# Patient Record
Sex: Female | Born: 1990 | Race: White | Hispanic: Yes | Marital: Single | Smoking: Current every day smoker
Health system: Southern US, Community
[De-identification: ages and names within clinical notes are randomized; demographics above are authoritative.]

## PROBLEM LIST (undated history)

## (undated) ENCOUNTER — Inpatient Hospital Stay: Payer: Self-pay

## (undated) DIAGNOSIS — F32A Depression, unspecified: Secondary | ICD-10-CM

## (undated) DIAGNOSIS — F329 Major depressive disorder, single episode, unspecified: Secondary | ICD-10-CM

## (undated) DIAGNOSIS — M539 Dorsopathy, unspecified: Secondary | ICD-10-CM

## (undated) DIAGNOSIS — F419 Anxiety disorder, unspecified: Secondary | ICD-10-CM

## (undated) DIAGNOSIS — G43909 Migraine, unspecified, not intractable, without status migrainosus: Secondary | ICD-10-CM

## (undated) HISTORY — PX: DENTAL SURGERY: SHX609

## (undated) HISTORY — DX: Migraine, unspecified, not intractable, without status migrainosus: G43.909

## (undated) HISTORY — DX: Dorsopathy, unspecified: M53.9

---

## 2014-08-22 ENCOUNTER — Emergency Department: Payer: Self-pay | Admitting: Emergency Medicine

## 2014-11-30 ENCOUNTER — Emergency Department: Payer: Self-pay | Admitting: Student

## 2015-08-09 ENCOUNTER — Emergency Department
Admission: EM | Admit: 2015-08-09 | Discharge: 2015-08-09 | Disposition: A | Discharge status: Home / Self Care | Payer: Self-pay | Attending: Emergency Medicine | Admitting: Emergency Medicine

## 2015-08-09 ENCOUNTER — Encounter: Payer: Self-pay | Admitting: Medical Oncology

## 2015-08-09 DIAGNOSIS — G8929 Other chronic pain: Secondary | ICD-10-CM | POA: Insufficient documentation

## 2015-08-09 DIAGNOSIS — K0381 Cracked tooth: Secondary | ICD-10-CM | POA: Insufficient documentation

## 2015-08-09 DIAGNOSIS — Z880 Allergy status to penicillin: Secondary | ICD-10-CM | POA: Insufficient documentation

## 2015-08-09 DIAGNOSIS — K047 Periapical abscess without sinus: Secondary | ICD-10-CM | POA: Insufficient documentation

## 2015-08-09 DIAGNOSIS — F17200 Nicotine dependence, unspecified, uncomplicated: Secondary | ICD-10-CM | POA: Insufficient documentation

## 2015-08-09 MED ORDER — HYDROCODONE-ACETAMINOPHEN 5-325 MG PO TABS: 1.0000 | ORAL_TABLET | ORAL | Status: DC | PRN

## 2015-08-09 MED ORDER — CEPHALEXIN 500 MG PO CAPS: 500.0000 mg | ORAL_CAPSULE | Freq: Three times a day (TID) | ORAL | Status: AC

## 2015-08-09 NOTE — ED Notes (Signed)
Assessment per PA 

## 2015-08-09 NOTE — ED Notes (Signed)
Rt upper tooth ache x 3 days, swelling noted.

## 2015-08-09 NOTE — Discharge Instructions (Signed)
Dental Abscess °A dental abscess is a collection of pus in or around a tooth. °CAUSES °This condition is caused by a bacterial infection around the root of the tooth that involves the inner part of the tooth (pulp). It may result from: °· Severe tooth decay. °· Trauma to the tooth that allows bacteria to enter into the pulp, such as a broken or chipped tooth. °· Severe gum disease around a tooth. °SYMPTOMS °Symptoms of this condition include: °· Severe pain in and around the infected tooth. °· Swelling and redness around the infected tooth, in the mouth, or in the face. °· Tenderness. °· Pus drainage. °· Bad breath. °· Bitter taste in the mouth. °· Difficulty swallowing. °· Difficulty opening the mouth. °· Nausea. °· Vomiting. °· Chills. °· Swollen neck glands. °· Fever. °DIAGNOSIS °This condition is diagnosed with examination of the infected tooth. During the exam, your dentist may tap on the infected tooth. Your dentist will also ask about your medical and dental history and may order X-rays. °TREATMENT °This condition is treated by eliminating the infection. This may be done with: °· Antibiotic medicine. °· A root canal. This may be performed to save the tooth. °· Pulling (extracting) the tooth. This may also involve draining the abscess. This is done if the tooth cannot be saved. °HOME CARE INSTRUCTIONS °· Take medicines only as directed by your dentist. °· If you were prescribed antibiotic medicine, finish all of it even if you start to feel better. °· Rinse your mouth (gargle) often with salt water to relieve pain or swelling. °· Do not drive or operate heavy machinery while taking pain medicine. °· Do not apply heat to the outside of your mouth. °· Keep all follow-up visits as directed by your dentist. This is important. °SEEK MEDICAL CARE IF: °· Your pain is worse and is not helped by medicine. °SEEK IMMEDIATE MEDICAL CARE IF: °· You have a fever or chills. °· Your symptoms suddenly get worse. °· You have a  very bad headache. °· You have problems breathing or swallowing. °· You have trouble opening your mouth. °· You have swelling in your neck or around your eye. °  °This information is not intended to replace advice given to you by your health care provider. Make sure you discuss any questions you have with your health care provider. °  °Document Released: 08/24/2005 Document Revised: 01/08/2015 Document Reviewed: 08/21/2014 °Elsevier Interactive Patient Education ©2016 [employer] ° ° ° ° ° ° °OPTIONS FOR DENTAL FOLLOW UP CARE ° °[place] Department of Health and Human Services - Local Safety Net Dental Clinics °http://www.ncdhhs.gov/dph/oralhealth/services/safetynetclinics.htm °  °[facility] (336-562-3123) ° °[facility] (919-933-9087) ° °[facility] (919-663-1744 ext 237) ° °[facility] (336-570-6415) ° °[facility] (919-968-2025) °This clinic caters to the indigent population and is on a lottery system. °Location: °[facility], [address], [place] °Clinic Hours: °Wednesdays from 6pm - 9pm, patients seen by a lottery system. °For dates, call or go to www.med.[facility].edu/[facility]/patients/Dental-[facility] °Services: °Cleanings, fillings and simple extractions. °Payment Options: °DENTAL WORK IS FREE OF CHARGE. Bring proof of income or support. °Best way to get seen: °Arrive at 5:15 pm - this is a lottery, NOT first come/first serve, so arriving earlier will not increase your chances of being seen. °  °  °[facility] °919-537-3737 °Select option 1 for emergencies °  °Location: °[facility], [address], [place] °  Clinic Hours: °No walk-ins accepted - call the day before to schedule an appointment. °Check in times are 9:30 am and 1:30 pm. °Services: °Simple extractions, temporary fillings, pulpectomy/pulp debridement, uncomplicated abscess drainage. °Payment Options: °PAYMENT IS  DUE AT THE TIME OF SERVICE.  Fee is usually $100-200, additional surgical procedures (e.g. abscess drainage) may be extra. °Cash, checks, Visa/MasterCard accepted.  Can file Medicaid if patient is covered for dental - patient should call case worker to check. °No discount for [facility] patients. °Best way to get seen: °MUST call the day before and get onto the schedule. Can usually be seen the next 1-2 days. No walk-ins accepted. °  °  °[facility] °919-933-9087 °  °Location: °[facility], [address], [place] °Clinic Hours: °M, W, Th, F 8am or 1:30pm, Tues 9a or 1:30 - first come/first served. °Services: °Simple extractions, temporary fillings, uncomplicated abscess drainage.  You do not need to be an [place] resident. °Payment Options: °PAYMENT IS DUE AT THE TIME OF SERVICE. °Dental insurance, otherwise sliding scale - bring proof of income or support. °Depending on income and treatment needed, cost is usually $50-200. °Best way to get seen: °Arrive early as it is first come/first served. °  °  °[facility] °919-542-1641 °  °Location: °[address] °Clinic Hours: °Mon-Thu 8a-5p °Services: °Most basic dental services including extractions and fillings. °Payment Options: °PAYMENT IS DUE AT THE TIME OF SERVICE. °Sliding scale, up to 50% off - bring proof if income or support. °Medicaid with dental option accepted. °Best way to get seen: °Call to schedule an appointment, can usually be seen within 2 weeks OR they will try to see walk-ins - show up at 8a or 2p (you may have to wait). °  °  °[facility] °919-245-2435 °[place] RESIDENTS ONLY °  °Location: °[facility], [address], [place], [place] [address] °Clinic Hours: By appointment only. °Monday - Thursday 8am-5pm, Friday 8am-12pm °Services: Cleanings, fillings, extractions. °Payment Options: °PAYMENT IS DUE AT THE TIME OF  SERVICE. °Cash, Visa or MasterCard. Sliding scale - $30 minimum per service. °Best way to get seen: °Come in to office, complete packet and make an appointment - need proof of income °or support monies for each household member and proof of [place] residence. °Usually takes about a month to get in. °  °  °[facility] °[phone] °  °Location: °[address] [place] °Clinic Hours: Walk-in Urgent Care Dental Services are offered Monday-Friday mornings only. °The numbers of emergencies accepted daily is limited to the number of °providers available. °Maximum 15 - Mondays, Wednesdays & Thursdays °Maximum 10 - Tuesdays & Fridays °Services: °You do not need to be a [place] resident to be seen for a dental emergency. °Emergencies are defined as pain, swelling, abnormal bleeding, or dental trauma. Walkins will receive x-rays if needed. °NOTE: Dental cleaning is not an emergency. °Payment Options: °PAYMENT IS DUE AT THE TIME OF SERVICE. °Minimum co-pay is $40.00 for uninsured patients. °Minimum co-pay is $3.00 for Medicaid with dental coverage. °Dental Insurance is accepted and must be presented at time of visit. °Medicare does not cover dental. °Forms of payment: Cash, credit card, checks. °Best way to get seen: °If not previously registered with the clinic, walk-in dental registration begins at 7:15 am and is on a first come/first serve basis. °If previously registered with the clinic, call to make an appointment. °  °  °  [facility] °919-776-4359 °[place] RESIDENTS ONLY °  °Location: °[address], [place], [place] °Clinic Hours: °Mon-Thu 10a-2p °Services: Extractions only! °Payment Options: °FREE (donations accepted) - bring proof of income or support °Best way to get seen: °Call and schedule an appointment OR come at 8am on the 1st Monday of every month (except for holidays) when it is first come/first served. °  °  °[facility] °[phone] °   °Location: °[address], [place] °Clinic Hours: °Friday mornings °Services, Payment Options, Best way to get seen: °Call for info °

## 2015-08-09 NOTE — ED Provider Notes (Signed)
[facility] Emergency Department Provider Note ____________________________________________  Time seen: Approximately 7:56 AM  I have reviewed the triage vital signs and the nursing notes.   HISTORY  Chief Complaint Dental Pain   HPI Marcia Villarreal is a 24 y.o. female who presents to the emergency department for evaluation of dental pain and facial swelling. She reports chronic dental pain and several fractured teeth. Pain started yesterday and swelling was noted upon awakening this morning. She hasn't taken Tylenol without relief.   History reviewed. No pertinent past medical history.  There are no active problems to display for this patient.   History reviewed. No pertinent past surgical history.  Current Outpatient Rx  Name  Route  Sig  Dispense  Refill  . cephALEXin (KEFLEX) 500 MG capsule   Oral   Take 1 capsule (500 mg total) by mouth 3 (three) times daily.   40 capsule   0   . HYDROcodone-acetaminophen (NORCO/VICODIN) 5-325 MG tablet   Oral   Take 1 tablet by mouth every 4 (four) hours as needed.   12 tablet   0     Allergies Penicillins and Sulfa antibiotics  No family history on file.  Social History Social History  Substance Use Topics  . Smoking status: Current Every Day Smoker  . Smokeless tobacco: None  . Alcohol Use: None    Review of Systems Constitutional: No fever/chills Eyes: No visual changes. ENT: No sore throat. Cardiovascular: Denies chest pain. Respiratory: Denies shortness of breath. Gastrointestinal: No abdominal pain.  No nausea, no vomiting.  Genitourinary: Negative for dysuria. Musculoskeletal: Negative for back pain. Skin: Negative for rash. Neurological: Negative for headaches, focal weakness or numbness. 10-point ROS otherwise negative.  ____________________________________________   PHYSICAL EXAM:  VITAL SIGNS: ED Triage Vitals  Enc Vitals Group     BP 08/09/15 0746 142/80 mmHg   Pulse Rate 08/09/15 0746 106     Resp 08/09/15 0746 16     Temp 08/09/15 0746 97.6 F (36.4 C)     Temp Source 08/09/15 0746 Oral     SpO2 08/09/15 0746 96 %     Weight 08/09/15 0746 220 lb (99.791 kg)     Height 08/09/15 0746 5\' 5"  (1.651 m)     Head Cir --      Peak Flow --      Pain Score 08/09/15 0748 9     Pain Loc --      Pain Edu? --      Excl. in GC? --     Constitutional: Alert and oriented. Well appearing and in no acute distress. Eyes: Conjunctivae are normal. PERRL. EOMI. Head: Atraumatic. Nose: No congestion/rhinnorhea. Mouth/Throat: Mucous membranes are moist.  Oropharynx non-erythematous. Periodontal Exam    Neck: No stridor.  Hematological/Lymphatic/Immunilogical: No cervical lymphadenopathy. Cardiovascular:   Good peripheral circulation. Respiratory: Normal respiratory effort.  No retractions. Musculoskeletal: No lower extremity tenderness nor edema.  No joint effusions. Neurologic:  Normal speech and language. No gross focal neurologic deficits are appreciated. Speech is normal. No gait instability. Skin:  Skin is warm, dry and intact. No rash noted. Psychiatric: Mood and affect are normal. Speech and behavior are normal.  ____________________________________________   LABS (all labs ordered are listed, but only abnormal results are displayed)  Labs Reviewed - No data to display ____________________________________________   RADIOLOGY  Not indicated ____________________________________________   PROCEDURES  Procedure(s) performed: None  Critical Care performed: No  ____________________________________________   INITIAL IMPRESSION / ASSESSMENT AND PLAN /  ED COURSE  Pertinent labs & imaging results that were available during my care of the patient were reviewed by me and considered in my medical decision making (see chart for details).  Patient was advised to see the dentist within 14 days. Also advised to take the antibiotic until  finished. Instructed to return to the ER for symptoms that change or worsen if you are unable to schedule an appointment. ____________________________________________   FINAL CLINICAL IMPRESSION(S) / ED DIAGNOSES  Final diagnoses:  Dental abscess       Chinita Pester, FNP 08/09/15 0820  Myrna Blazer, MD 08/09/15 1540

## 2015-11-16 IMAGING — CR DG CHEST 2V
1 series · 2 of 2 positions shown · non-contrast
Comparison: None.

CLINICAL DATA: Initial evaluation for cough.

EXAM:
CHEST  2 VIEW

[Series 1: dxr chest pa (or ap) and lateral · 0.14mm/px · 2 of 2 slices shown]
[im 1/2]
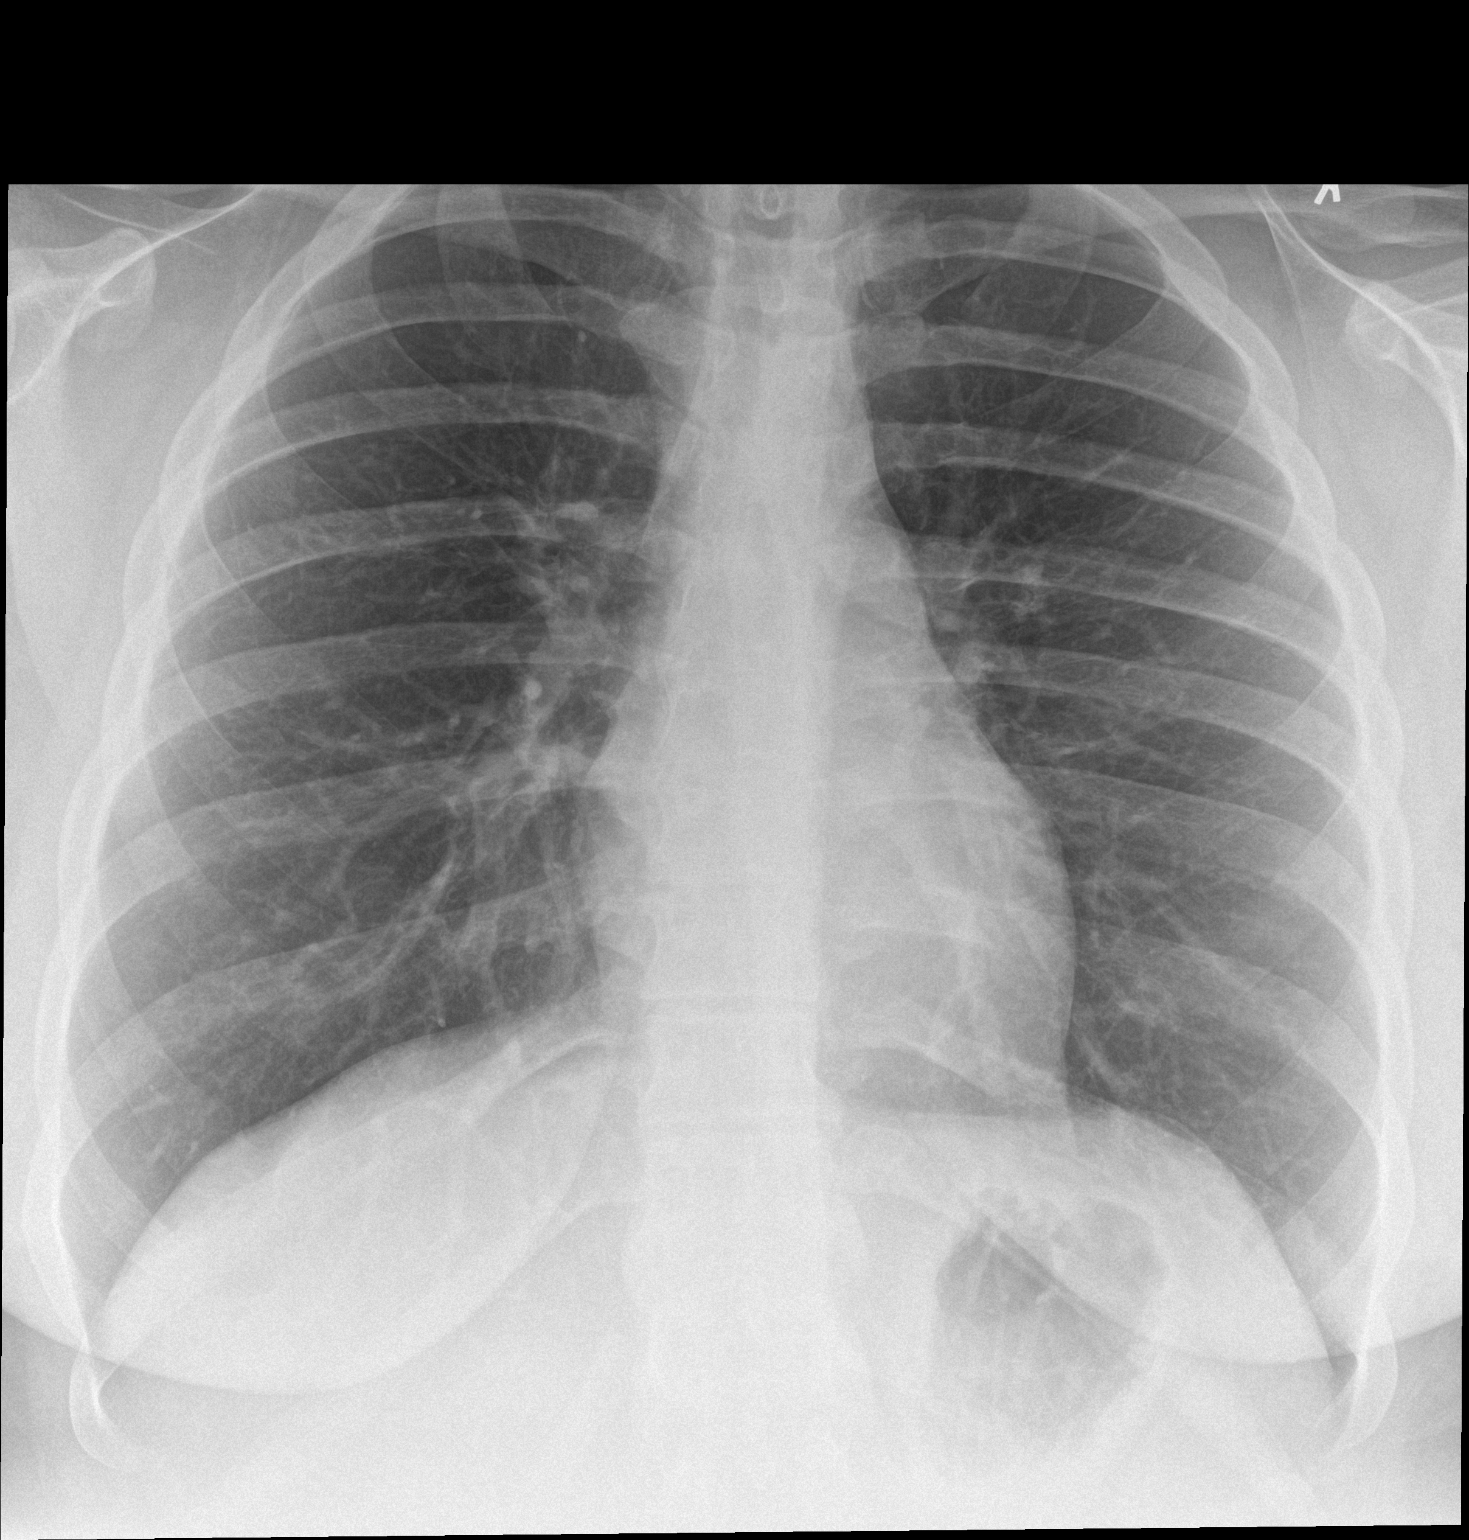
[im 2/2]
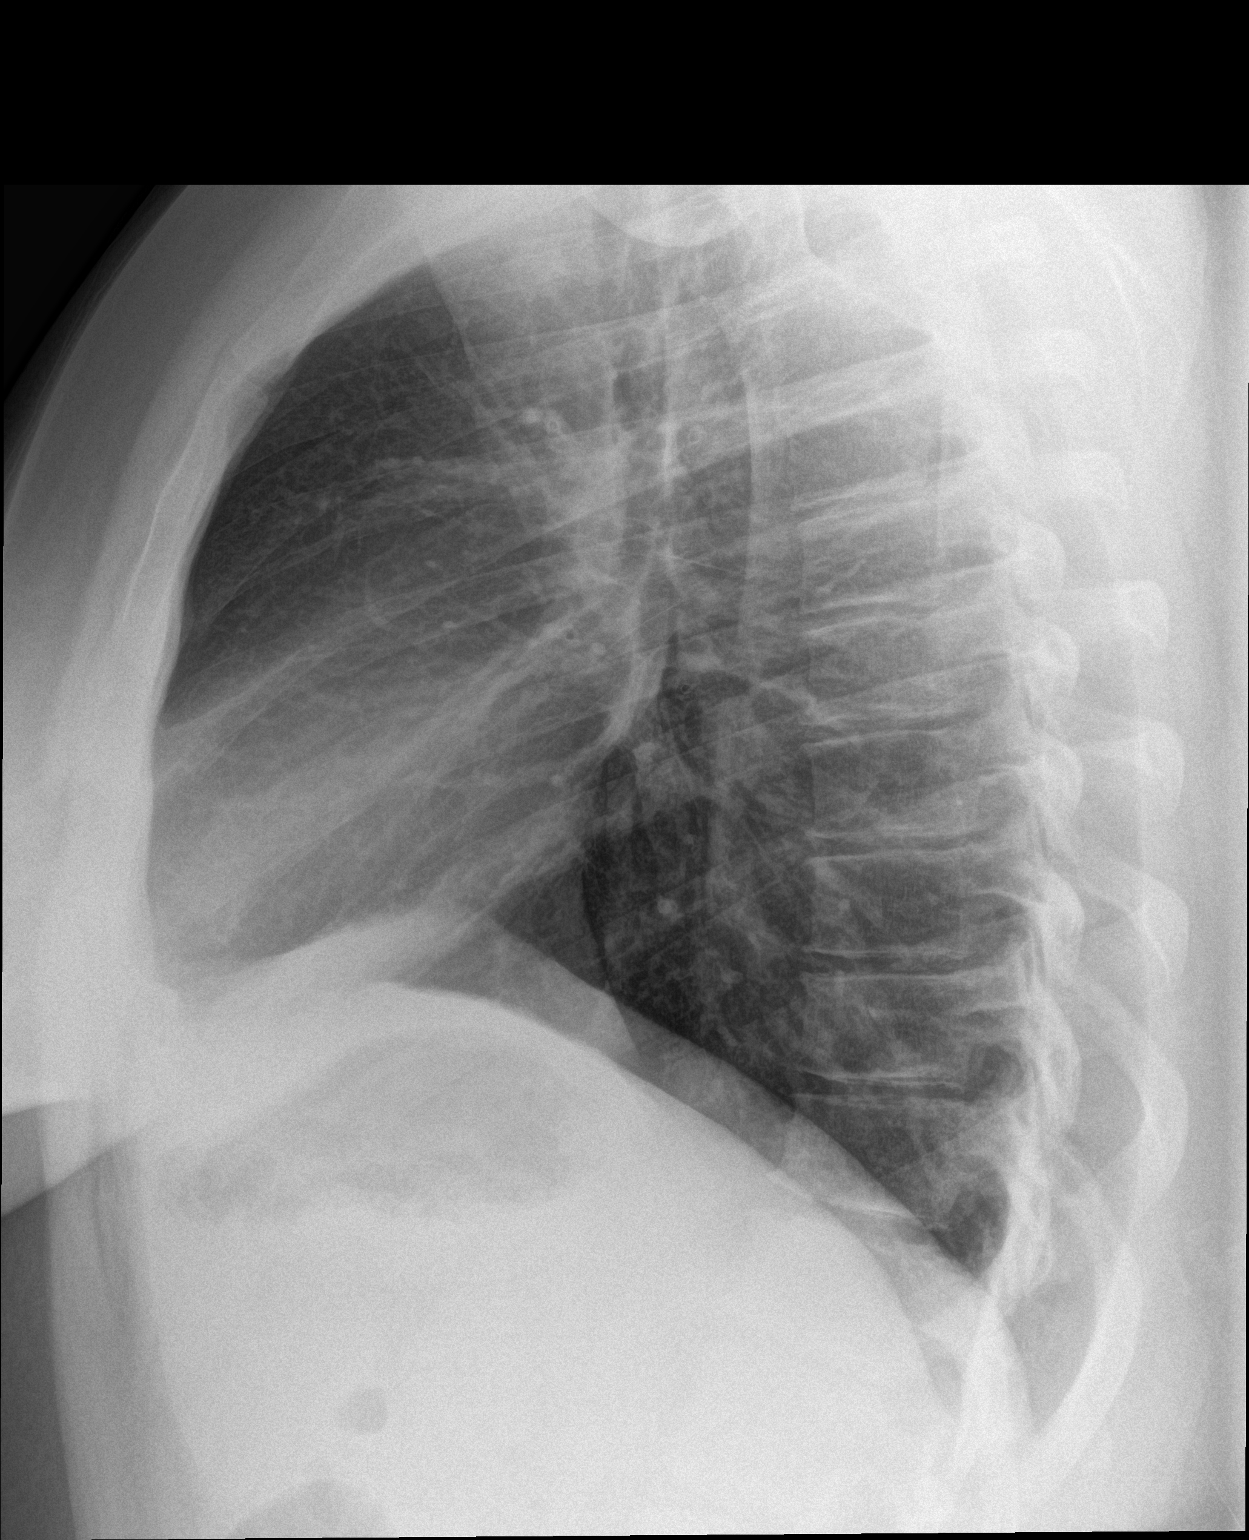

[2 of 2 positions shown; findings below may reference images not displayed]

FINDINGS: The cardiac and mediastinal silhouettes are within normal limits.

The lungs are normally inflated. No airspace consolidation, pleural
effusion, or pulmonary edema is identified. There is no
pneumothorax.

No acute osseous abnormality identified.
IMPRESSION: No active cardiopulmonary disease.

## 2017-04-16 ENCOUNTER — Ambulatory Visit (INDEPENDENT_AMBULATORY_CARE_PROVIDER_SITE_OTHER): Payer: Medicaid Other | Admitting: Certified Nurse Midwife

## 2017-04-16 VITALS — BP 111/98 | HR 95 | Ht 65.0 in | Wt 272.0 lb

## 2017-04-16 DIAGNOSIS — R109 Unspecified abdominal pain: Secondary | ICD-10-CM

## 2017-04-16 DIAGNOSIS — N926 Irregular menstruation, unspecified: Secondary | ICD-10-CM

## 2017-04-16 DIAGNOSIS — Z1389 Encounter for screening for other disorder: Secondary | ICD-10-CM

## 2017-04-16 DIAGNOSIS — O26899 Other specified pregnancy related conditions, unspecified trimester: Secondary | ICD-10-CM

## 2017-04-16 DIAGNOSIS — Z3481 Encounter for supervision of other normal pregnancy, first trimester: Secondary | ICD-10-CM

## 2017-04-16 DIAGNOSIS — Z113 Encounter for screening for infections with a predominantly sexual mode of transmission: Secondary | ICD-10-CM

## 2017-04-16 NOTE — Progress Notes (Signed)
Marcia Villarreal presents for NOB nurse interview visit. Pregnancy confirmation done at [facility] on July 10,2018. UPT: positive.  Pt has had some abdominal cramping, somewhat more than menstrual type that comes and goes. No vaginal bleeding. Hx of irregular menses. Ultrasound ordered for abdominal pain, hx irregular menses. Encouraged pt to drink lots of water and rest, this should help. Currently drinking 2-3 cups coffee, some water.  G-2.  P-1001. Pregnancy education material explained and given. No cats in the home. NOB labs ordered. TSH/HbgA1c due to BMI of 48. HIV labs and Drug screen were explained optional and she did not decline. Drug screen ordered. Pt states she has smoked Marijuana during pregnancy a few times because of nausea. Does not plan to continue. PNV encouraged. Genetic screening options discussed. Genetic testing: Unsure.  To contact insurance regarding what is covered. Pt may discuss with provider. Pt. To follow up with provider in 3 weeks for NOB physical.  All questions answered.

## 2017-04-16 NOTE — Patient Instructions (Signed)
Pregnancy and Zika Virus Disease Zika virus disease, or Zika, is an illness that can spread to people from mosquitoes that carry the virus. It may also spread from person to person through infected body fluids. Zika first occurred in [place], but recently it has spread to new areas. The virus occurs in tropical climates. The location of Zika continues to change. Most people who become infected with Zika virus do not develop serious illness. However, Zika may cause birth defects in an unborn baby whose mother is infected with the virus. It may also increase the risk of miscarriage. What are the symptoms of Zika virus disease? In many cases, people who have been infected with Zika virus do not develop any symptoms. If symptoms appear, they usually start about a week after the person is infected. Symptoms are usually mild. They may include:  Fever.  Rash.  Red eyes.  Joint pain.  How does Zika virus disease spread? The main way that Zika virus spreads is through the bite of a certain type of mosquito. Unlike most types of mosquitos, which bite only at night, the type of mosquito that carries Zika virus bites both at night and during the day. Zika virus can also spread through sexual contact, through a blood transfusion, and from a mother to her baby before or during birth. Once you have had Zika virus disease, it is unlikely that you will get it again. Can I pass Zika to my baby during pregnancy? Yes, Zika can pass from a mother to her baby before or during birth. What problems can Zika cause for my baby? A woman who is infected with Zika virus while pregnant is at risk of having her baby born with a condition in which the brain or head is smaller than expected (microcephaly). Babies who have microcephaly can have developmental delays, seizures, hearing problems, and vision problems. Having Zika virus disease during pregnancy can also increase the risk of miscarriage. How can Zika virus disease be  prevented? There is no vaccine to prevent Zika. The best way to prevent the disease is to avoid infected mosquitoes and avoid exposure to body fluids that can spread the virus. Avoid any possible exposure to Zika by taking the following precautions. For women and their sex partners:  Avoid traveling to high-risk areas. The locations where Zika is being reported change often. To identify high-risk areas, check the CDC travel website: www.cdc.gov/zika/geo/index.html  If you or your sex partner must travel to a high-risk area, talk with a health care provider before and after traveling.  Take all precautions to avoid mosquito bites if you live in, or travel to, any of the high-risk areas. Insect repellents are safe to use during pregnancy.  Ask your health care provider when it is safe to have sexual contact.  For women:  If you are pregnant or trying to become pregnant, avoid sexual contact with persons who may have been exposed to Zika virus, persons who have possible symptoms of Zika, or persons whose history you are unsure about. If you choose to have sexual contact with someone who may have been exposed to Zika virus, use condoms correctly during the entire duration of sexual activity, every time. Do not share sexual devices, as you may be exposed to body fluids.  Ask your health care provider about when it is safe to attempt pregnancy after a possible exposure to Zika virus.  What steps should I take to avoid mosquito bites? Take these steps to avoid mosquito bites   when you are in a high-risk area:  Wear loose clothing that covers your arms and legs.  Limit your outdoor activities.  Do not open windows unless they have window screens.  Sleep under mosquito nets.  Use insect repellent. The best insect repellents have:  DEET, picaridin, oil of lemon eucalyptus (OLE), or IR3535 in them.  Higher amounts of an active ingredient in them.  Remember that insect repellents are safe to  use during pregnancy.  Do not use OLE on children who are younger than 3 years of age. Do not use insect repellent on babies who are younger than 2 months of age.  Cover your child's stroller with mosquito netting. Make sure the netting fits snugly and that any loose netting does not cover your child's mouth or nose. Do not use a blanket as a mosquito-protection cover.  Do not apply insect repellent underneath clothing.  If you are using sunscreen, apply the sunscreen before applying the insect repellent.  Treat clothing with permethrin. Do not apply permethrin directly to your skin. Follow label directions for safe use.  Get rid of standing water, where mosquitoes may reproduce. Standing water is often found in items such as buckets, bowls, animal food dishes, and flowerpots.  When you return from traveling to any high-risk area, continue taking actions to protect yourself against mosquito bites for 3 weeks, even if you show no signs of illness. This will prevent spreading Zika virus to uninfected mosquitoes. What should I know about the sexual transmission of Zika? People can spread Zika to their sexual partners during vaginal, anal, or oral sex, or by sharing sexual devices. Many people with Zika do not develop symptoms, so a person could spread the disease without knowing that they are infected. The greatest risk is to women who are pregnant or who may become pregnant. Zika virus can live longer in semen than it can live in blood. Couples can prevent sexual transmission of the virus by:  Using condoms correctly during the entire duration of sexual activity, every time. This includes vaginal, anal, and oral sex.  Not sharing sexual devices. Sharing increases your risk of being exposed to body fluid from another person.  Avoiding all sexual activity until your health care provider says it is safe.  Should I be tested for Zika virus? A sample of your blood can be tested for Zika virus. A  pregnant woman should be tested if she may have been exposed to the virus or if she has symptoms of Zika. She may also have additional tests done during her pregnancy, such ultrasound testing. Talk with your health care provider about which tests are recommended. This information is not intended to replace advice given to you by your health care provider. Make sure you discuss any questions you have with your health care provider. Document Released: 05/15/2015 Document Revised: 01/30/2016 Document Reviewed: 05/08/2015 Elsevier Interactive Patient Education  2018 [employer] Hyperemesis Gravidarum Hyperemesis gravidarum is a severe form of nausea and vomiting that happens during pregnancy. Hyperemesis is worse than morning sickness. It may cause you to have nausea or vomiting all day for many days. It may keep you from eating and drinking enough food and liquids. Hyperemesis usually occurs during the first half (the first 20 weeks) of pregnancy. It often goes away once a woman is in her second half of pregnancy. However, sometimes hyperemesis continues through an entire pregnancy. What are the causes? The cause of this condition is not known. It may be related   to changes in chemicals (hormones) in the body during pregnancy, such as the high level of pregnancy hormone (human chorionic gonadotropin) or the increase in the female sex hormone (estrogen). What are the signs or symptoms? Symptoms of this condition include:  Severe nausea and vomiting.  Nausea that does not go away.  Vomiting that does not allow you to keep any food down.  Weight loss.  Body fluid loss (dehydration).  Having no desire to eat, or not liking food that you have previously enjoyed.  How is this diagnosed? This condition may be diagnosed based on:  A physical exam.  Your medical history.  Your symptoms.  Blood tests.  Urine tests.  How is this treated? This condition may be managed with medicine. If  medicines to do not help relieve nausea and vomiting, you may need to receive fluids through an IV tube at the hospital. Follow these instructions at home:  Take over-the-counter and prescription medicines only as told by your health care provider.  Avoid iron pills and multivitamins that contain iron for the first 3-4 months of pregnancy. If you take prescription iron pills, do not stop taking them unless your health care provider approves.  Take the following actions to help prevent nausea and vomiting: ? In the morning, before getting out of bed, try eating a couple of dry crackers or a piece of toast. ? Avoid foods and smells that upset your stomach. Fatty and spicy foods may make nausea worse. ? Eat 5-6 small meals a day. ? Do not drink fluids while eating meals. Drink between meals. ? Eat or suck on things that have ginger in them. Ginger can help relieve nausea. ? Avoid food preparation. The smell of food can spoil your appetite or trigger nausea.  Follow instructions from your health care provider about eating or drinking restrictions.  For snacks, eat high-protein foods, such as cheese.  Keep all follow-up and pre-birth (prenatal) visits as told by your health care provider. This is important. Contact a health care provider if:  You have pain in your abdomen.  You have a severe headache.  You have vision problems.  You are losing weight. Get help right away if:  You cannot drink fluids without vomiting.  You vomit blood.  You have constant nausea and vomiting.  You are very weak.  You are very thirsty.  You feel dizzy.  You faint.  You have a fever or other symptoms that last for more than 2-3 days.  You have a fever and your symptoms suddenly get worse. Summary  Hyperemesis gravidarum is a severe form of nausea and vomiting that happens during pregnancy.  Making some changes to your eating habits may help relieve nausea and vomiting.  This condition may  be managed with medicine.  If medicines to do not help relieve nausea and vomiting, you may need to receive fluids through an IV tube at the hospital. This information is not intended to replace advice given to you by your health care provider. Make sure you discuss any questions you have with your health care provider. Document Released: 08/24/2005 Document Revised: 04/22/2016 Document Reviewed: 04/22/2016 Elsevier Interactive Patient Education  2017 [employer] First Trimester of Pregnancy The first trimester of pregnancy is from week 1 until the end of week 13 (months 1 through 3). During this time, your baby will begin to develop inside you. At 6-8 weeks, the eyes and face are formed, and the heartbeat can be seen on ultrasound. At the   end of 12 weeks, all the baby's organs are formed. Prenatal care is all the medical care you receive before the birth of your baby. Make sure you get good prenatal care and follow all of your doctor's instructions. Follow these instructions at home: Medicines  Take over-the-counter and prescription medicines only as told by your doctor. Some medicines are safe and some medicines are not safe during pregnancy.  Take a prenatal vitamin that contains at least 600 micrograms (mcg) of folic acid.  If you have trouble pooping (constipation), take medicine that will make your stool soft (stool softener) if your doctor approves. Eating and drinking  Eat regular, healthy meals.  Your doctor will tell you the amount of weight gain that is right for you.  Avoid raw meat and uncooked cheese.  If you feel sick to your stomach (nauseous) or throw up (vomit): ? Eat 4 or 5 small meals a day instead of 3 large meals. ? Try eating a few soda crackers. ? Drink liquids between meals instead of during meals.  To prevent constipation: ? Eat foods that are high in fiber, like fresh fruits and vegetables, whole grains, and beans. ? Drink enough fluids to keep your pee  (urine) clear or pale yellow. Activity  Exercise only as told by your doctor. Stop exercising if you have cramps or pain in your lower belly (abdomen) or low back.  Do not exercise if it is too hot, too humid, or if you are in a place of great height (high altitude).  Try to avoid standing for long periods of time. Move your legs often if you must stand in one place for a long time.  Avoid heavy lifting.  Wear low-heeled shoes. Sit and stand up straight.  You can have sex unless your doctor tells you not to. Relieving pain and discomfort  Wear a good support bra if your breasts are sore.  Take warm water baths (sitz baths) to soothe pain or discomfort caused by hemorrhoids. Use hemorrhoid cream if your doctor says it is okay.  Rest with your legs raised if you have leg cramps or low back pain.  If you have puffy, bulging veins (varicose veins) in your legs: ? Wear support hose or compression stockings as told by your doctor. ? Raise (elevate) your feet for 15 minutes, 3-4 times a day. ? Limit salt in your food. Prenatal care  Schedule your prenatal visits by the twelfth week of pregnancy.  Write down your questions. Take them to your prenatal visits.  Keep all your prenatal visits as told by your doctor. This is important. Safety  Wear your seat belt at all times when driving.  Make a list of emergency phone numbers. The list should include numbers for family, friends, the hospital, and police and fire departments. General instructions  Ask your doctor for a referral to a local prenatal class. Begin classes no later than at the start of month 6 of your pregnancy.  Ask for help if you need counseling or if you need help with nutrition. Your doctor can give you advice or tell you where to go for help.  Do not use hot tubs, steam rooms, or saunas.  Do not douche or use tampons or scented sanitary pads.  Do not cross your legs for long periods of time.  Avoid all herbs  and alcohol. Avoid drugs that are not approved by your doctor.  Do not use any tobacco products, including cigarettes, chewing tobacco, and electronic cigarettes.   If you need help quitting, ask your doctor. You may get counseling or other support to help you quit.  Avoid cat litter boxes and soil used by cats. These carry germs that can cause birth defects in the baby and can cause a loss of your baby (miscarriage) or stillbirth.  Visit your dentist. At home, brush your teeth with a soft toothbrush. Be gentle when you floss. Contact a doctor if:  You are dizzy.  You have mild cramps or pressure in your lower belly.  You have a nagging pain in your belly area.  You continue to feel sick to your stomach, you throw up, or you have watery poop (diarrhea).  You have a bad smelling fluid coming from your vagina.  You have pain when you pee (urinate).  You have increased puffiness (swelling) in your face, hands, legs, or ankles. Get help right away if:  You have a fever.  You are leaking fluid from your vagina.  You have spotting or bleeding from your vagina.  You have very bad belly cramping or pain.  You gain or lose weight rapidly.  You throw up blood. It may look like coffee grounds.  You are around people who have German measles, fifth disease, or chickenpox.  You have a very bad headache.  You have shortness of breath.  You have any kind of trauma, such as from a fall or a car accident. Summary  The first trimester of pregnancy is from week 1 until the end of week 13 (months 1 through 3).  To take care of yourself and your unborn baby, you will need to eat healthy meals, take medicines only if your doctor tells you to do so, and do activities that are safe for you and your baby.  Keep all follow-up visits as told by your doctor. This is important as your doctor will have to ensure that your baby is healthy and growing well. This information is not intended to replace  advice given to you by your health care provider. Make sure you discuss any questions you have with your health care provider. Document Released: 02/10/2008 Document Revised: 09/01/2016 Document Reviewed: 09/01/2016 Elsevier Interactive Patient Education  2017 [employer] Commonly Asked Questions During Pregnancy  Cats: A parasite can be excreted in cat feces.  To avoid exposure you need to have another person empty the little box.  If you must empty the litter box you will need to wear gloves.  Wash your hands after handling your cat.  This parasite can also be found in raw or undercooked meat so this should also be avoided.  Colds, Sore Throats, Flu: Please check your medication sheet to see what you can take for symptoms.  If your symptoms are unrelieved by these medications please call the office.  Dental Work: Most any dental work your dentist recommends is permitted.  X-rays should only be taken during the first trimester if absolutely necessary.  Your abdomen should be shielded with a lead apron during all x-rays.  Please notify your provider prior to receiving any x-rays.  Novocaine is fine; gas is not recommended.  If your dentist requires a note from us prior to dental work please call the office and we will provide one for you.  Exercise: Exercise is an important part of staying healthy during your pregnancy.  You may continue most exercises you were accustomed to prior to pregnancy.  Later in your pregnancy you will most likely notice you have difficulty with activities   requiring balance like riding a bicycle.  It is important that you listen to your body and avoid activities that put you at a higher risk of falling.  Adequate rest and staying well hydrated are a must!  If you have questions about the safety of specific activities ask your provider.    Exposure to Children with illness: Try to avoid obvious exposure; report any symptoms to us when noted,  If you have chicken pos, red  measles or mumps, you should be immune to these diseases.   Please do not take any vaccines while pregnant unless you have checked with your OB provider.  Fetal Movement: After 28 weeks we recommend you do "kick counts" twice daily.  Lie or sit down in a calm quiet environment and count your baby movements "kicks".  You should feel your baby at least 10 times per hour.  If you have not felt 10 kicks within the first hour get up, walk around and have something sweet to eat or drink then repeat for an additional hour.  If count remains less than 10 per hour notify your provider.  Fumigating: Follow your pest control agent's advice as to how long to stay out of your home.  Ventilate the area well before re-entering.  Hemorrhoids:   Most over-the-counter preparations can be used during pregnancy.  Check your medication to see what is safe to use.  It is important to use a stool softener or fiber in your diet and to drink lots of liquids.  If hemorrhoids seem to be getting worse please call the office.   Hot Tubs:  Hot tubs Jacuzzis and saunas are not recommended while pregnant.  These increase your internal body temperature and should be avoided.  Intercourse:  Sexual intercourse is safe during pregnancy as long as you are comfortable, unless otherwise advised by your provider.  Spotting may occur after intercourse; report any bright red bleeding that is heavier than spotting.  Labor:  If you know that you are in labor, please go to the hospital.  If you are unsure, please call the office and let us help you decide what to do.  Lifting, straining, etc:  If your job requires heavy lifting or straining please check with your provider for any limitations.  Generally, you should not lift items heavier than that you can lift simply with your hands and arms (no back muscles)  Painting:  Paint fumes do not harm your pregnancy, but may make you ill and should be avoided if possible.  Latex or water based paints  have less odor than oils.  Use adequate ventilation while painting.  Permanents & Hair Color:  Chemicals in hair dyes are not recommended as they cause increase hair dryness which can increase hair loss during pregnancy.  " Highlighting" and permanents are allowed.  Dye may be absorbed differently and permanents may not hold as well during pregnancy.  Sunbathing:  Use a sunscreen, as skin burns easily during pregnancy.  Drink plenty of fluids; avoid over heating.  Tanning Beds:  Because their possible side effects are still unknown, tanning beds are not recommended.  Ultrasound Scans:  Routine ultrasounds are performed at approximately 20 weeks.  You will be able to see your baby's general anatomy an if you would like to know the gender this can usually be determined as well.  If it is questionable when you conceived you may also receive an ultrasound early in your pregnancy for dating purposes.  Otherwise ultrasound exams   are not routinely performed unless there is a medical necessity.  Although you can request a scan we ask that you pay for it when conducted because insurance does not cover " patient request" scans.  Work: If your pregnancy proceeds without complications you may work until your due date, unless your physician or employer advises otherwise.  Round Ligament Pain/Pelvic Discomfort:  Sharp, shooting pains not associated with bleeding are fairly common, usually occurring in the second trimester of pregnancy.  They tend to be worse when standing up or when you remain standing for long periods of time.  These are the result of pressure of certain pelvic ligaments called "round ligaments".  Rest, Tylenol and heat seem to be the most effective relief.  As the womb and fetus grow, they rise out of the pelvis and the discomfort improves.  Please notify the office if your pain seems different than that described.  It may represent a more serious condition.   

## 2017-04-17 LAB — CBC WITH DIFFERENTIAL/PLATELET
BASOS: 0 %
Basophils Absolute: 0 10*3/uL (ref 0.0–0.2)
EOS (ABSOLUTE): 0.2 10*3/uL (ref 0.0–0.4)
EOS: 2 %
HEMATOCRIT: 41 % (ref 34.0–46.6)
HEMOGLOBIN: 13.2 g/dL (ref 11.1–15.9)
IMMATURE GRANS (ABS): 0 10*3/uL (ref 0.0–0.1)
IMMATURE GRANULOCYTES: 0 %
Lymphocytes Absolute: 2.8 10*3/uL (ref 0.7–3.1)
Lymphs: 28 %
MCH: 28.7 pg (ref 26.6–33.0)
MCHC: 32.2 g/dL (ref 31.5–35.7)
MCV: 89 fL (ref 79–97)
MONOCYTES: 7 %
MONOS ABS: 0.7 10*3/uL (ref 0.1–0.9)
NEUTROS PCT: 63 %
Neutrophils Absolute: 6.2 10*3/uL (ref 1.4–7.0)
Platelets: 215 10*3/uL (ref 150–379)
RBC: 4.6 x10E6/uL (ref 3.77–5.28)
RDW: 15.1 % (ref 12.3–15.4)
WBC: 9.8 10*3/uL (ref 3.4–10.8)

## 2017-04-17 LAB — VARICELLA ZOSTER ANTIBODY, IGG: Varicella zoster IgG: <135 index — ABNORMAL LOW (ref >165)

## 2017-04-17 LAB — HEMOGLOBIN A1C
ESTIMATED AVERAGE GLUCOSE: 120 mg/dL
Hgb A1c MFr Bld: 5.8 % — ABNORMAL HIGH (ref 4.8–5.6)

## 2017-04-17 LAB — ANTIBODY SCREEN: Antibody Screen: NEGATIVE

## 2017-04-17 LAB — HIV ANTIBODY (ROUTINE TESTING W REFLEX): HIV SCREEN 4TH GENERATION: NONREACTIVE

## 2017-04-17 LAB — RUBELLA SCREEN: RUBELLA: 2.55 {index} (ref >0.99)

## 2017-04-17 LAB — TSH: TSH: 1.32 u[IU]/mL (ref 0.450–4.500)

## 2017-04-17 LAB — HEPATITIS B SURFACE ANTIGEN: Hepatitis B Surface Ag: NEGATIVE

## 2017-04-17 LAB — RPR: RPR: NONREACTIVE

## 2017-04-17 LAB — RH TYPE: Rh Factor: POSITIVE

## 2017-04-17 LAB — ABO

## 2017-04-18 LAB — GC/CHLAMYDIA PROBE AMP
Chlamydia trachomatis, NAA: NEGATIVE
Neisseria gonorrhoeae by PCR: NEGATIVE

## 2017-04-18 LAB — CULTURE, OB URINE

## 2017-04-18 LAB — URINE CULTURE, OB REFLEX

## 2017-04-22 ENCOUNTER — Encounter: Payer: Self-pay | Admitting: Certified Nurse Midwife

## 2017-04-22 ENCOUNTER — Telehealth: Payer: Self-pay | Admitting: Certified Nurse Midwife

## 2017-04-22 NOTE — Telephone Encounter (Signed)
Pt stated she did not understand any of her lab results. Results explained.

## 2017-04-22 NOTE — Telephone Encounter (Signed)
Please call patient - [facility]

## 2017-04-23 LAB — MICROSCOPIC EXAMINATION: CASTS: NONE SEEN /LPF

## 2017-04-23 LAB — MONITOR DRUG PROFILE 14(MW)
AMPHETAMINE SCREEN URINE: NEGATIVE ng/mL
BARBITURATE SCREEN URINE: NEGATIVE ng/mL
BENZODIAZEPINE SCREEN, URINE: NEGATIVE ng/mL
Buprenorphine, Urine: NEGATIVE ng/mL
Cocaine (Metab) Scrn, Ur: NEGATIVE ng/mL
Creatinine(Crt), U: 283.5 mg/dL (ref 20.0–300.0)
Fentanyl, Urine: NEGATIVE pg/mL
Meperidine Screen, Urine: NEGATIVE ng/mL
Methadone Screen, Urine: NEGATIVE ng/mL
OPIATE SCREEN URINE: NEGATIVE ng/mL
OXYCODONE+OXYMORPHONE UR QL SCN: NEGATIVE ng/mL
PROPOXYPHENE SCREEN URINE: NEGATIVE ng/mL
Ph of Urine: 6 (ref 4.5–8.9)
Phencyclidine Qn, Ur: NEGATIVE ng/mL
SPECIFIC GRAVITY: 1.025
TRAMADOL SCREEN, URINE: NEGATIVE ng/mL

## 2017-04-23 LAB — URINALYSIS, ROUTINE W REFLEX MICROSCOPIC
BILIRUBIN UA: NEGATIVE
Glucose, UA: NEGATIVE
Nitrite, UA: NEGATIVE
PH UA: 6 (ref 5.0–7.5)
RBC, UA: NEGATIVE
Specific Gravity, UA: >=1.03 — AB (ref 1.005–1.030)
UUROB: 0.2 mg/dL (ref 0.2–1.0)

## 2017-04-23 LAB — CANNABINOID (GC/MS), URINE
CANNABINOID UR: POSITIVE — AB
CARBOXY THC UR: 51 ng/mL

## 2017-04-23 LAB — NICOTINE SCREEN, URINE: COTININE UR QL SCN: POSITIVE ng/mL — AB

## 2017-04-26 ENCOUNTER — Other Ambulatory Visit: Payer: Medicaid Other

## 2017-04-26 ENCOUNTER — Telehealth: Payer: Self-pay | Admitting: Certified Nurse Midwife

## 2017-04-26 NOTE — Telephone Encounter (Signed)
Patient wants a different prenatal vitamin - leaves a bad taste.  Please call

## 2017-04-27 NOTE — Telephone Encounter (Signed)
Samples put up front for pt.

## 2017-05-03 ENCOUNTER — Other Ambulatory Visit: Payer: Self-pay | Admitting: Certified Nurse Midwife

## 2017-05-03 ENCOUNTER — Telehealth: Payer: Self-pay

## 2017-05-03 ENCOUNTER — Ambulatory Visit (INDEPENDENT_AMBULATORY_CARE_PROVIDER_SITE_OTHER): Payer: Medicaid Other | Admitting: Certified Nurse Midwife

## 2017-05-03 ENCOUNTER — Other Ambulatory Visit (INDEPENDENT_AMBULATORY_CARE_PROVIDER_SITE_OTHER): Payer: Medicaid Other

## 2017-05-03 VITALS — BP 114/81 | HR 86 | Wt 265.9 lb

## 2017-05-03 DIAGNOSIS — O209 Hemorrhage in early pregnancy, unspecified: Secondary | ICD-10-CM | POA: Diagnosis not present

## 2017-05-03 DIAGNOSIS — N926 Irregular menstruation, unspecified: Secondary | ICD-10-CM

## 2017-05-03 NOTE — Telephone Encounter (Signed)
Pt calls and states that she is having a "stabbing pain" in her abdomen that is peristent and is progressively getting worse. Pt states that she was supposed to have an u/s due to this but was unable to make that appt. Pt also notes that today she is having vaginal bleeding that varies from light pink spotting to bright red blood. Advised pt of the need to be evaluated, pt gave verbal understanding. Added pt to AT's schedule for 05/03/17 at 2pm.

## 2017-05-03 NOTE — Progress Notes (Signed)
Subjective:    Marcia Villarreal is a 26 y.o. female. [name] reports bleeding since yesterday. She states that it was bright red in color that required her to wear a pad. Today it has turned to pink mucus discharge. She said that she also has a pelvic pain that is 8/10 on pain scale.This pain had been present since she found out she was pregnant but has progressed in intensity. She has not tried anything to help the pain.  she has  She is not in acute distress. Ectopic risks: none.  Pregnancy testing: qual HCG negative on 03/16/17 at [facility]. Pregnancy imaging: scheduled for today at 330 pm. Blood type: AB positive. Other lab results: none.  The following portions of the patient's history were reviewed and updated as appropriate: allergies, current medications, past family history, past medical history, past social history, past surgical history and problem list.  Review of Systems Pertinent items are noted in HPI.   Objective:     BP 114/81   Pulse 86   Wt 265 lb 14.4 oz (120.6 kg)   LMP 02/11/2017 (Exact Date) Comment: hx irregular periods  BMI 44.25 kg/m  General:   alert, cooperative, appears stated age and mild distress  Heart: regular rate and rhythm  Lungs: clear to auscultation bilaterally  Abdomen: soft, non-tender, without masses or organomegaly and exam compromised due to body habitus  Pelvic: Vaginal: normal mucosa without prolapse or lesions Cervix: normal appearance and yellow /clear mucus Adnexa: tenderness Uterus: tender                 Vulva: normal              Vagina:  NuSwab               Cervix: cervical motion tenderness and no blood seen                Uterus: difficult to assess due to body habitus              Adnexa: Tenderness on exam   Imaging Limited office ultrasound: Scheduled for today @ 330 pm    Assessment:      Abdominal pain in early pregnancy Bleeding in early pregnancy   Plan:    Blood type and Rh:  positive. Follow-up appointment with Pattricia Boss  on Thursday as scheduled .  - POCT urinalysis dipstick    Preterm labor symptoms: vaginal bleeding, contractions and leaking of fluid reviewed in detail.  Fetal movement precautions reviewed.  Follow up in as scheduled. Doreene Burke, CNM

## 2017-05-03 NOTE — Patient Instructions (Signed)
Vaginal Bleeding During Pregnancy, First Trimester °A small amount of bleeding (spotting) from the vagina is common in early pregnancy. Sometimes the bleeding is normal and is not a problem, and sometimes it is a sign of something serious. Be sure to tell your doctor about any bleeding from your vagina right away. °Follow these instructions at home: °· Watch your condition for any changes. °· Follow your doctor's instructions about how active you can be. °· If you are on bed rest: °? You may need to stay in bed and only get up to use the bathroom. °? You may be allowed to do some activities. °? If you need help, make plans for someone to help you. °· Write down: °? The number of pads you use each day. °? How often you change pads. °? How soaked (saturated) your pads are. °· Do not use tampons. °· Do not douche. °· Do not have sex or orgasms until your doctor says it is okay. °· If you pass any tissue from your vagina, save the tissue so you can show it to your doctor. °· Only take medicines as told by your doctor. °· Do not take aspirin because it can make you bleed. °· Keep all follow-up visits as told by your doctor. °Contact a doctor if: °· You bleed from your vagina. °· You have cramps. °· You have labor pains. °· You have a fever that does not go away after you take medicine. °Get help right away if: °· You have very bad cramps in your back or belly (abdomen). °· You pass large clots or tissue from your vagina. °· You bleed more. °· You feel light-headed or weak. °· You pass out (faint). °· You have chills. °· You are leaking fluid or have a gush of fluid from your vagina. °· You pass out while pooping (having a bowel movement). °This information is not intended to replace advice given to you by your health care provider. Make sure you discuss any questions you have with your health care provider. °Document Released: 01/08/2014 Document Revised: 01/30/2016 Document Reviewed: 05/01/2013 °Elsevier Interactive  Patient Education © 2018 Elsevier Inc. ° °

## 2017-05-06 ENCOUNTER — Other Ambulatory Visit: Payer: Medicaid Other

## 2017-05-06 ENCOUNTER — Encounter: Payer: Medicaid Other | Admitting: Certified Nurse Midwife

## 2017-05-08 LAB — NUSWAB VAGINITIS PLUS (VG+)
Atopobium vaginae: HIGH Score — AB
BVAB 2: HIGH Score — AB
CANDIDA GLABRATA, NAA: NEGATIVE
Candida albicans, NAA: NEGATIVE
Chlamydia trachomatis, NAA: NEGATIVE
Megasphaera 1: HIGH Score — AB
Neisseria gonorrhoeae, NAA: NEGATIVE
TRICH VAG BY NAA: NEGATIVE

## 2017-05-09 ENCOUNTER — Other Ambulatory Visit: Payer: Self-pay | Admitting: Certified Nurse Midwife

## 2017-05-09 ENCOUNTER — Encounter: Payer: Self-pay | Admitting: Certified Nurse Midwife

## 2017-05-09 MED ORDER — METRONIDAZOLE 500 MG PO TABS: 500.0000 mg | ORAL_TABLET | Freq: Two times a day (BID) | ORAL | 0 refills | Status: AC

## 2017-05-09 NOTE — Progress Notes (Signed)
PT nuswab positive for BV. Order placed for flagyl , message sent via my chart.  Doreene BurkeAnnie Shamarcus Hoheisel, CNM

## 2017-05-18 ENCOUNTER — Ambulatory Visit (INDEPENDENT_AMBULATORY_CARE_PROVIDER_SITE_OTHER): Payer: Medicaid Other

## 2017-05-18 ENCOUNTER — Ambulatory Visit (INDEPENDENT_AMBULATORY_CARE_PROVIDER_SITE_OTHER): Payer: Medicaid Other | Admitting: Certified Nurse Midwife

## 2017-05-18 ENCOUNTER — Encounter: Payer: Self-pay | Admitting: Certified Nurse Midwife

## 2017-05-18 VITALS — BP 97/63 | HR 90 | Wt 264.5 lb

## 2017-05-18 DIAGNOSIS — Z6838 Body mass index (BMI) 38.0-38.9, adult: Secondary | ICD-10-CM | POA: Insufficient documentation

## 2017-05-18 DIAGNOSIS — O26899 Other specified pregnancy related conditions, unspecified trimester: Secondary | ICD-10-CM | POA: Diagnosis not present

## 2017-05-18 DIAGNOSIS — R109 Unspecified abdominal pain: Secondary | ICD-10-CM | POA: Diagnosis not present

## 2017-05-18 DIAGNOSIS — Z3482 Encounter for supervision of other normal pregnancy, second trimester: Secondary | ICD-10-CM | POA: Diagnosis not present

## 2017-05-18 DIAGNOSIS — F1291 Cannabis use, unspecified, in remission: Secondary | ICD-10-CM

## 2017-05-18 DIAGNOSIS — Z3481 Encounter for supervision of other normal pregnancy, first trimester: Secondary | ICD-10-CM | POA: Diagnosis not present

## 2017-05-18 DIAGNOSIS — Z87898 Personal history of other specified conditions: Secondary | ICD-10-CM

## 2017-05-18 DIAGNOSIS — Z124 Encounter for screening for malignant neoplasm of cervix: Secondary | ICD-10-CM

## 2017-05-18 DIAGNOSIS — N926 Irregular menstruation, unspecified: Secondary | ICD-10-CM

## 2017-05-18 DIAGNOSIS — O09891 Supervision of other high risk pregnancies, first trimester: Secondary | ICD-10-CM

## 2017-05-18 DIAGNOSIS — Z6841 Body Mass Index (BMI) 40.0 and over, adult: Secondary | ICD-10-CM

## 2017-05-18 LAB — POCT URINALYSIS DIPSTICK
Bilirubin, UA: NEGATIVE
Blood, UA: NEGATIVE
GLUCOSE UA: NEGATIVE
KETONES UA: NEGATIVE
Nitrite, UA: NEGATIVE
Protein, UA: NEGATIVE
SPEC GRAV UA: 1.015 (ref 1.010–1.025)
Urobilinogen, UA: 0.2 E.U./dL
pH, UA: 7 (ref 5.0–8.0)

## 2017-05-18 MED ORDER — PROVIDA DHA 16-16-1.25-110 MG PO CAPS: 1.0000 | ORAL_CAPSULE | Freq: Every day | ORAL | 6 refills | Status: DC

## 2017-05-18 NOTE — Patient Instructions (Signed)
Second Trimester of Pregnancy The second trimester is from week 13 through week 28, month 4 through 6. This is often the time in pregnancy that you feel your best. Often times, morning sickness has lessened or quit. You may have more energy, and you may get hungry more often. Your unborn baby (fetus) is growing rapidly. At the end of the sixth month, he or she is about 9 inches long and weighs about 1 pounds. You will likely feel the baby move (quickening) between 18 and 20 weeks of pregnancy. Follow these instructions at home:  Avoid all smoking, herbs, and alcohol. Avoid drugs not approved by your doctor.  Do not use any tobacco products, including cigarettes, chewing tobacco, and electronic cigarettes. If you need help quitting, ask your doctor. You may get counseling or other support to help you quit.  Only take medicine as told by your doctor. Some medicines are safe and some are not during pregnancy.  Exercise only as told by your doctor. Stop exercising if you start having cramps.  Eat regular, healthy meals.  Wear a good support bra if your breasts are tender.  Do not use hot tubs, steam rooms, or saunas.  Wear your seat belt when driving.  Avoid raw meat, uncooked cheese, and liter boxes and soil used by cats.  Take your prenatal vitamins.  Take 1500-2000 milligrams of calcium daily starting at the 20th week of pregnancy until you deliver your baby.  Try taking medicine that helps you poop (stool softener) as needed, and if your doctor approves. Eat more fiber by eating fresh fruit, vegetables, and whole grains. Drink enough fluids to keep your pee (urine) clear or pale yellow.  Take warm water baths (sitz baths) to soothe pain or discomfort caused by hemorrhoids. Use hemorrhoid cream if your doctor approves.  If you have puffy, bulging veins (varicose veins), wear support hose. Raise (elevate) your feet for 15 minutes, 3-4 times a day. Limit salt in your diet.  Avoid heavy  lifting, wear low heals, and sit up straight.  Rest with your legs raised if you have leg cramps or low back pain.  Visit your dentist if you have not gone during your pregnancy. Use a soft toothbrush to brush your teeth. Be gentle when you floss.  You can have sex (intercourse) unless your doctor tells you not to.  Go to your doctor visits. Get help if:  You feel dizzy.  You have mild cramps or pressure in your lower belly (abdomen).  You have a nagging pain in your belly area.  You continue to feel sick to your stomach (nauseous), throw up (vomit), or have watery poop (diarrhea).  You have bad smelling fluid coming from your vagina.  You have pain with peeing (urination). Get help right away if:  You have a fever.  You are leaking fluid from your vagina.  You have spotting or bleeding from your vagina.  You have severe belly cramping or pain.  You lose or gain weight rapidly.  You have trouble catching your breath and have chest pain.  You notice sudden or extreme puffiness (swelling) of your face, hands, ankles, feet, or legs.  You have not felt the baby move in over an hour.  You have severe headaches that do not go away with medicine.  You have vision changes. This information is not intended to replace advice given to you by your health care provider. Make sure you discuss any questions you have with your health care   provider. Document Released: 11/18/2009 Document Revised: 01/30/2016 Document Reviewed: 10/25/2012 Elsevier Interactive Patient Education  2017 Elsevier Inc.  

## 2017-05-18 NOTE — Progress Notes (Signed)
NEW OB HISTORY AND PHYSICAL  SUBJECTIVE:       Marcia Villarreal is a 26 y.o. 462P1001 female, Patient's last menstrual period was 02/11/2017 (exact date)., Estimated Date of Delivery: 11/18/17, 2219w5d, presents today for establishment of Prenatal Care.  Denies difficulty breathing or respiratory distress, nausea and vomiting, breast tenderness, chest pain, abdominal pain, vaginal bleeding, and leg pain or swelling.   Patient desires genetic screening, but has not check coverage with insurance company.    Gynecologic History  Patient's last menstrual period was 02/11/2017 (exact date).  Contraception: none  Last Pap: unknown. Results were: normal  Obstetric History OB History  Gravida Para Term Preterm AB Living  2 1 1     1   SAB TAB Ectopic Multiple Live Births          1    # Outcome Date GA Lbr Len/2nd Weight Sex Delivery Anes PTL Lv  2 Current           1 Term 05/19/16 8072w0d  8 lb 2.4 oz (3.697 kg) M Vag-Spont  N LIV      Past Medical History:  Diagnosis Date  . Back problem   . Migraines     Past Surgical History:  Procedure Laterality Date  . DENTAL SURGERY      No current outpatient prescriptions on file prior to visit.   No current facility-administered medications on file prior to visit.     Allergies  Allergen Reactions  . Penicillins   . Sulfa Antibiotics     Social History   Social History  . Marital status: Single    Spouse name: N/A  . Number of children: N/A  . Years of education: N/A   Occupational History  . Not on file.   Social History Main Topics  . Smoking status: Current Every Day Smoker  . Smokeless tobacco: Never Used  . Alcohol use No  . Drug use: Yes    Types: Marijuana     Comment: before she found out she was pregnancy  . Sexual activity: Not Currently    Partners: Male    Birth control/ protection: None     Comment: not sexually active x1 month   Other Topics Concern  . Not on file   Social History Narrative  . No  narrative on file    Family History  Problem Relation Age of Onset  . Cancer Mother   . Osteoarthritis Maternal Grandmother   . Diabetes Maternal Grandmother   . Cancer Maternal Grandmother 60       breast cancer  . Heart failure Maternal Grandfather   . Hyperlipidemia Maternal Grandfather   . Hypertension Maternal Grandfather   . Cancer Maternal Grandfather        prostate    The following portions of the patient's history were reviewed and updated as appropriate: allergies, current medications, past OB history, past medical history, past surgical history, past family history, past social history, and problem list.  OBJECTIVE:  Initial Physical Exam (New OB)  BP 97/63   Pulse 90   Wt 264 lb 8 oz (120 kg)   LMP 02/11/2017 (Exact Date) Comment: hx irregular periods  BMI 44.02 kg/m   GENERAL APPEARANCE: alert, well appearing, in no apparent distress  HEAD: normocephalic, atraumatic  MOUTH: mucous membranes moist, pharynx normal without lesions  THYROID: no thyromegaly or masses present  BREASTS: no masses noted, no significant tenderness, no palpable axillary nodes, no skin changes  LUNGS: clear to auscultation, no  wheezes, rales or rhonchi, symmetric air entry  HEART: regular rate and rhythm, no murmurs  ABDOMEN: soft, nontender, nondistended, no abnormal masses, no epigastric pain, obese, fundus not palpable and FHT present  EXTREMITIES: no redness or tenderness in the calves or thighs, no edema  SKIN: normal coloration and turgor, no rashes  LYMPH NODES: no adenopathy palpable  NEUROLOGIC: alert, oriented, normal speech, no focal findings or movement disorder noted  PELVIC EXAM EXTERNAL GENITALIA: normal appearing vulva with no masses, tenderness or lesions VAGINA: no abnormal discharge or lesions CERVIX: no lesions or cervical motion tenderness and Pap collected UTERUS: gravid ADNEXA: unable to palpate due to maternal habitus OB EXAM PELVIMETRY: appears  adequate  ASSESSMENT: Normal pregnancy Short interval between pregnancy BMI >40 History of marijuana use  PLAN: Prenatal care Desires genetic screening-will contact insurance company for coverage.  New OB counseling: The patient has been given an overview regarding routine prenatal care. Recommendations regarding diet, weight gain, and exercise in pregnancy were given. Discussed BMI perimeters for care at [facility] and recommended next ROB with MD.  Prenatal testing, optional genetic testing, and ultrasound use in pregnancy were reviewed.  Benefits of Breast Feeding were discussed. The patient is encouraged to consider nursing her baby post partum. See orders   Gunnar Bulla, CNM

## 2017-05-18 NOTE — Addendum Note (Signed)
Addended by: Jackquline DenmarkIDGEWAY, Eartha Vonbehren W on: 05/18/2017 04:57 PM   Modules accepted: Orders

## 2017-05-19 LAB — PAP IG W/ RFLX HPV ASCU: PAP Smear Comment: 0

## 2017-05-20 ENCOUNTER — Telehealth: Payer: Self-pay | Admitting: Certified Nurse Midwife

## 2017-05-20 NOTE — Telephone Encounter (Signed)
Patient called and stated that she has yet to receive her prenatal vitamins that she is expecting and would like the "Provider OB" prenatal vitamins. The patient would like a call back to confirm if she is stating the right prenatal vitamin. Patient did not disclose any other information. Please advise.

## 2017-05-20 NOTE — Telephone Encounter (Signed)
Left message that Provida DHA was called in as we discussed [facility] [address] If any further problems, please contact office.

## 2017-06-15 ENCOUNTER — Encounter: Payer: Medicaid Other | Admitting: Obstetrics and Gynecology

## 2017-08-06 ENCOUNTER — Other Ambulatory Visit: Payer: Self-pay | Admitting: Certified Nurse Midwife

## 2017-08-06 DIAGNOSIS — Z369 Encounter for antenatal screening, unspecified: Secondary | ICD-10-CM

## 2017-08-09 ENCOUNTER — Ambulatory Visit (INDEPENDENT_AMBULATORY_CARE_PROVIDER_SITE_OTHER): Payer: Medicaid Other

## 2017-08-09 ENCOUNTER — Encounter: Payer: Self-pay | Admitting: Certified Nurse Midwife

## 2017-08-09 ENCOUNTER — Ambulatory Visit (INDEPENDENT_AMBULATORY_CARE_PROVIDER_SITE_OTHER): Payer: Medicaid Other | Admitting: Certified Nurse Midwife

## 2017-08-09 VITALS — BP 110/63 | HR 81 | Wt 268.7 lb

## 2017-08-09 DIAGNOSIS — Z369 Encounter for antenatal screening, unspecified: Secondary | ICD-10-CM

## 2017-08-09 DIAGNOSIS — Z130 Encounter for screening for diseases of the blood and blood-forming organs and certain disorders involving the immune mechanism: Secondary | ICD-10-CM

## 2017-08-09 DIAGNOSIS — Z23 Encounter for immunization: Secondary | ICD-10-CM

## 2017-08-09 DIAGNOSIS — Z3482 Encounter for supervision of other normal pregnancy, second trimester: Secondary | ICD-10-CM

## 2017-08-09 DIAGNOSIS — Z131 Encounter for screening for diabetes mellitus: Secondary | ICD-10-CM

## 2017-08-09 DIAGNOSIS — O0932 Supervision of pregnancy with insufficient antenatal care, second trimester: Secondary | ICD-10-CM

## 2017-08-09 LAB — POCT URINALYSIS DIPSTICK
BILIRUBIN UA: NEGATIVE
Blood, UA: NEGATIVE
Glucose, UA: NEGATIVE
Ketones, UA: NEGATIVE
NITRITE UA: NEGATIVE
Protein, UA: NEGATIVE
SPEC GRAV UA: 1.01 (ref 1.010–1.025)
Urobilinogen, UA: 0.2 E.U./dL
pH, UA: 6 (ref 5.0–8.0)

## 2017-08-09 NOTE — Patient Instructions (Signed)
Common Medications Safe in Pregnancy  Acne:      Constipation:  Benzoyl Peroxide     Colace  Clindamycin      Dulcolax Suppository  Topica Erythromycin     Fibercon  Salicylic Acid      Metamucil         Miralax AVOID:        Senakot   Accutane    Cough:  Retin-A       Cough Drops  Tetracycline      Phenergan w/ Codeine if Rx  Minocycline      Robitussin (Plain & DM)  Antibiotics:     Crabs/Lice:  Ceclor       RID  Cephalosporins    AVOID:  E-Mycins      Kwell  Keflex  Macrobid/Macrodantin   Diarrhea:  Penicillin      Kao-Pectate  Zithromax      Imodium AD         PUSH FLUIDS AVOID:       Cipro     Fever:  Tetracycline      Tylenol (Regular or Extra  Minocycline       Strength)  Levaquin      Extra Strength-Do not          Exceed 8 tabs/24 hrs Caffeine:        <200mg/day (equiv. To 1 cup of coffee or  approx. 3 12 oz sodas)         Gas: Cold/Hayfever:       Gas-X  Benadryl      Mylicon  Claritin       Phazyme  **Claritin-D        Chlor-Trimeton    Headaches:  Dimetapp      ASA-Free Excedrin  Drixoral-Non-Drowsy     Cold Compress  Mucinex (Guaifenasin)     Tylenol (Regular or Extra  Sudafed/Sudafed-12 Hour     Strength)  **Sudafed PE Pseudoephedrine   Tylenol Cold & Sinus     Vicks Vapor Rub  Zyrtec  **AVOID if Problems With Blood Pressure         Heartburn: Avoid lying down for at least 1 hour after meals  Aciphex      Maalox     Rash:  Milk of Magnesia     Benadryl    Mylanta       1% Hydrocortisone Cream  Pepcid  Pepcid Complete   Sleep Aids:  Prevacid      Ambien   Prilosec       Benadryl  Rolaids       Chamomile Tea  Tums (Limit 4/day)     Unisom  Zantac       Tylenol PM         Warm milk-add vanilla or  Hemorrhoids:       Sugar for taste  Anusol/Anusol H.C.  (RX: Analapram 2.5%)  Sugar Substitutes:  Hydrocortisone OTC     Ok in moderation  Preparation H      Tucks        Vaseline lotion applied to tissue with  wiping    Herpes:     Throat:  Acyclovir      Oragel  Famvir  Valtrex     Vaccines:         Flu Shot Leg Cramps:       *Gardasil  Benadryl      Hepatitis A         Hepatitis B Nasal Spray:         Pneumovax  Saline Nasal Spray     Polio Booster         Tetanus Nausea:       Tuberculosis test or PPD  Vitamin B6 25 mg TID   AVOID:    Dramamine      *Gardasil  Emetrol       Live Poliovirus  Ginger Root 250 mg QID    MMR (measles, mumps &  High Complex Carbs @ Bedtime    rebella)  Sea Bands-Accupressure    Varicella (Chickenpox)  Unisom 1/2 tab TID     *No known complications           If received before Pain:         Known pregnancy;   Darvocet       Resume series after  Lortab        Delivery  Percocet    Yeast:   Tramadol      Femstat  Tylenol 3      Gyne-lotrimin  Ultram       Monistat  Vicodin           MISC:         All Sunscreens           Hair Coloring/highlights          Insect Repellant's          (Including DEET)         Mystic Tans Second Trimester of Pregnancy The second trimester is from week 13 through week 28, month 4 through 6. This is often the time in pregnancy that you feel your best. Often times, morning sickness has lessened or quit. You may have more energy, and you may get hungry more often. Your unborn baby (fetus) is growing rapidly. At the end of the sixth month, he or she is about 9 inches long and weighs about 1 pounds. You will likely feel the baby move (quickening) between 18 and 20 weeks of pregnancy. Follow these instructions at home:  Avoid all smoking, herbs, and alcohol. Avoid drugs not approved by your doctor.  Do not use any tobacco products, including cigarettes, chewing tobacco, and electronic cigarettes. If you need help quitting, ask your doctor. You may get counseling or other support to help you quit.  Only take medicine as told by your doctor. Some medicines are safe and some are not during pregnancy.  Exercise only as told by your  doctor. Stop exercising if you start having cramps.  Eat regular, healthy meals.  Wear a good support bra if your breasts are tender.  Do not use hot tubs, steam rooms, or saunas.  Wear your seat belt when driving.  Avoid raw meat, uncooked cheese, and liter boxes and soil used by cats.  Take your prenatal vitamins.  Take 1500-2000 milligrams of calcium daily starting at the 20th week of pregnancy until you deliver your baby.  Try taking medicine that helps you poop (stool softener) as needed, and if your doctor approves. Eat more fiber by eating fresh fruit, vegetables, and whole grains. Drink enough fluids to keep your pee (urine) clear or pale yellow.  Take warm water baths (sitz baths) to soothe pain or discomfort caused by hemorrhoids. Use hemorrhoid cream if your doctor approves.  If you have puffy, bulging veins (varicose veins), wear support hose. Raise (elevate) your feet for 15 minutes, 3-4 times a day. Limit salt in your diet.  Avoid heavy lifting, wear low heals, and sit up straight.    Rest with your legs raised if you have leg cramps or low back pain.  Visit your dentist if you have not gone during your pregnancy. Use a soft toothbrush to brush your teeth. Be gentle when you floss.  You can have sex (intercourse) unless your doctor tells you not to.  Go to your doctor visits. Get help if:  You feel dizzy.  You have mild cramps or pressure in your lower belly (abdomen).  You have a nagging pain in your belly area.  You continue to feel sick to your stomach (nauseous), throw up (vomit), or have watery poop (diarrhea).  You have bad smelling fluid coming from your vagina.  You have pain with peeing (urination). Get help right away if:  You have a fever.  You are leaking fluid from your vagina.  You have spotting or bleeding from your vagina.  You have severe belly cramping or pain.  You lose or gain weight rapidly.  You have trouble catching your  breath and have chest pain.  You notice sudden or extreme puffiness (swelling) of your face, hands, ankles, feet, or legs.  You have not felt the baby move in over an hour.  You have severe headaches that do not go away with medicine.  You have vision changes. This information is not intended to replace advice given to you by your health care provider. Make sure you discuss any questions you have with your health care provider. Document Released: 11/18/2009 Document Revised: 01/30/2016 Document Reviewed: 10/25/2012 Elsevier Interactive Patient Education  2017 Elsevier Inc.  

## 2017-08-09 NOTE — Progress Notes (Signed)
ROB-Pt states temporary relocation to [place] as reason for missed appointment. Anatomy scan today. Findings reviewed with pt excluding gender, pt verbalized understanding. Flu vaccine administered today. Panorama collected. Current BMI 44, discussed need for anesthesia consult, pt verbalized understanding. Anesthesia referral letter written and faxed, pt was given a copy as well. UDS today for history of marijuana use and missed appointment. Anticipatory guidance regarding course of PNC including labs, vaccines, and other milestones. Reviewed red flag symptoms and when to call. RTC x 3 weeks for 28 week labs (CBC, Glucola, RPR) and ROB with Pattricia Bossnnie or sooner if needed.   ULTRASOUND REPORT  Location: [facility] Date of Service:   08/09/17  Indications: Anatomy Findings:  Singleton intrauterine pregnancy is visualized with FHR at 150 BPM. Biometrics give an (U/S) Gestational age of 24 3/7 weeks and an (U/S) EDD of 11/19/17; this correlates with the clinically established EDD of 11/18/17.  Fetal presentation is breech.  EFW: 801 grams (1lb 12oz).  43rd percentile.   Placenta: Anterior and grade 1. AFI: WNL subjectively.  Anatomic survey is complete and appears WNL. Gender - Surprise.   Right Ovary measures 2.5 x 1.4 x 1.6 cm and appears WNL.  Left Ovary measures 1.7 x 1.7 x 1.2 cm and appears WNL.  There is no obvious evidence of a corpus luteal cyst. Survey of the adnexa demonstrates no adnexal masses. There is no free peritoneal fluid in the cul de sac.  Impression: 1. 25 3/7 week Viable Singleton Intrauterine pregnancy by U/S. 2. (U/S) EDD is consistent with Clinically established (LMP) EDD of 11/18/17. 3. Normal Anatomy Scan  Recommendations: 1.Clinical correlation with the patient's History and Physical Exam.

## 2017-08-09 NOTE — Progress Notes (Signed)
ROB- no complaints. Needs genetic testing today. Flu vaccine given.

## 2017-08-10 LAB — DRUG PROFILE, UR, 9 DRUGS (LABCORP)
AMPHETAMINES, URINE: NEGATIVE ng/mL
Barbiturate Quant, Ur: NEGATIVE ng/mL
Benzodiazepine Quant, Ur: NEGATIVE ng/mL
Cannabinoid Quant, Ur: NEGATIVE ng/mL
Cocaine (Metab.): NEGATIVE ng/mL
METHADONE SCREEN, URINE: NEGATIVE ng/mL
Opiate Quant, Ur: NEGATIVE ng/mL
PCP QUANT UR: NEGATIVE ng/mL
PROPOXYPHENE: NEGATIVE ng/mL

## 2017-08-14 ENCOUNTER — Encounter: Payer: Self-pay | Admitting: Certified Nurse Midwife

## 2017-08-16 ENCOUNTER — Inpatient Hospital Stay: Admission: RE | Admit: 2017-08-16 | Payer: Self-pay | Source: Ambulatory Visit

## 2017-09-03 ENCOUNTER — Encounter: Payer: Medicaid Other | Admitting: Certified Nurse Midwife

## 2017-09-06 ENCOUNTER — Encounter: Payer: Medicaid Other | Admitting: Certified Nurse Midwife

## 2017-09-06 ENCOUNTER — Other Ambulatory Visit: Payer: Medicaid Other

## 2017-09-07 NOTE — L&D Delivery Note (Signed)
Delivery Note   Marcia Villarreal is a 27 y.o. G2P1001 at 4415w0d Estimated Date of Delivery: 11/18/17  PRE-OPERATIVE DIAGNOSIS:  1) 4015w0d pregnancy.   POST-OPERATIVE DIAGNOSIS:  1) 10015w0d pregnancy s/p Vaginal, Spontaneous   Delivery Type: Vaginal, Spontaneous    Delivery Anesthesia: Epidural   Labor Complications:   none    ESTIMATED BLOOD LOSS: 200 ml    FINDINGS:   1) female infant, Apgar scores of 8   at 1 minute and 9    at 5 minutes and a birthweight of 105.12  ounces.    2) Nuchal cord: no   SPECIMENS:   PLACENTA:   Appearance: Intact , 3 vessels   Removal: Spontaneous      Disposition:    held per protocol then discarded DISPOSITION:  Infant to left in stable condition in the delivery room, with L&D personnel and mother,  NARRATIVE SUMMARY: Labor course:  [name] is a G2P1001 at 7115w0d who presented for labor management.  She progressed well in labor with pitocin.  She received the appropriate anesthesia and proceeded to complete dilation. She evidenced good maternal expulsive effort during the second stage. She went on to deliver a viable female infant. The placenta delivered without problems and was noted to be complete. A perineal and vaginal examination was performed. Episiotomy/Lacerations: None . The patient tolerated this well.  Doreene Burkennie Kensington Duerst, CNM  11/12/2017 9:35 AM

## 2017-09-08 ENCOUNTER — Other Ambulatory Visit: Payer: Self-pay

## 2017-09-08 ENCOUNTER — Ambulatory Visit (INDEPENDENT_AMBULATORY_CARE_PROVIDER_SITE_OTHER): Payer: Medicaid Other | Admitting: Certified Nurse Midwife

## 2017-09-08 ENCOUNTER — Encounter: Payer: Self-pay | Admitting: Certified Nurse Midwife

## 2017-09-08 ENCOUNTER — Other Ambulatory Visit: Payer: Medicaid Other

## 2017-09-08 VITALS — BP 112/65 | HR 100 | Wt 268.3 lb

## 2017-09-08 DIAGNOSIS — Z3493 Encounter for supervision of normal pregnancy, unspecified, third trimester: Secondary | ICD-10-CM

## 2017-09-08 DIAGNOSIS — Z131 Encounter for screening for diabetes mellitus: Secondary | ICD-10-CM

## 2017-09-08 DIAGNOSIS — Z130 Encounter for screening for diseases of the blood and blood-forming organs and certain disorders involving the immune mechanism: Secondary | ICD-10-CM

## 2017-09-08 DIAGNOSIS — Z3482 Encounter for supervision of other normal pregnancy, second trimester: Secondary | ICD-10-CM

## 2017-09-08 DIAGNOSIS — Z23 Encounter for immunization: Secondary | ICD-10-CM

## 2017-09-08 LAB — POCT URINALYSIS DIPSTICK
Bilirubin, UA: NEGATIVE
Blood, UA: NEGATIVE
GLUCOSE UA: NEGATIVE
Ketones, UA: NEGATIVE
Leukocytes, UA: NEGATIVE
Nitrite, UA: NEGATIVE
Protein, UA: NEGATIVE
SPEC GRAV UA: 1.01 (ref 1.010–1.025)
Urobilinogen, UA: 0.2 E.U./dL
pH, UA: 6.5 (ref 5.0–8.0)

## 2017-09-08 MED ORDER — TETANUS-DIPHTH-ACELL PERTUSSIS 5-2.5-18.5 LF-MCG/0.5 IM SUSP: 0.5000 mL | Freq: Once | INTRAMUSCULAR | Status: DC

## 2017-09-08 NOTE — Patient Instructions (Signed)

## 2017-09-08 NOTE — Progress Notes (Signed)
ROB, doing well. 1 hr GTT/Tdap/CBC/RPR/BTC today. Discussed cord blood donation public and private. Information given on [place] cord blood banking. Reviewed birth control options post partum. Pt thinks she wants to have a Tubal. Discussed options of birth control and booklet given. Consent signed for Tubal today. She verbalizes understanding that it is a permanent method. Will follow up with results of labs. Return in 2 wks. Body mass index is 44.65 kg/m.   Doreene BurkeAnnie Kieth Hartis, CNM

## 2017-09-08 NOTE — Progress Notes (Signed)
ROB- glucola done, blood consent signed, tdap given, pt is doing well 

## 2017-09-09 ENCOUNTER — Other Ambulatory Visit: Payer: Self-pay | Admitting: Certified Nurse Midwife

## 2017-09-09 ENCOUNTER — Encounter: Payer: Self-pay | Admitting: Certified Nurse Midwife

## 2017-09-09 DIAGNOSIS — R7309 Other abnormal glucose: Secondary | ICD-10-CM

## 2017-09-09 LAB — CBC
HEMOGLOBIN: 12.1 g/dL (ref 11.1–15.9)
Hematocrit: 36.6 % (ref 34.0–46.6)
MCH: 29.8 pg (ref 26.6–33.0)
MCHC: 33.1 g/dL (ref 31.5–35.7)
MCV: 90 fL (ref 79–97)
PLATELETS: 186 10*3/uL (ref 150–379)
RBC: 4.06 x10E6/uL (ref 3.77–5.28)
RDW: 13.6 % (ref 12.3–15.4)
WBC: 9.7 10*3/uL (ref 3.4–10.8)

## 2017-09-09 LAB — GLUCOSE, 1 HOUR GESTATIONAL: GESTATIONAL DIABETES SCREEN: 161 mg/dL — AB (ref 65–139)

## 2017-09-09 LAB — RPR: RPR: NONREACTIVE

## 2017-09-09 NOTE — Progress Notes (Signed)
1 hr GTT elevated. Pt notified via my chart to have 3 hr GTT completed. Orders placed.   Doreene BurkeAnnie Jhordan Mckibben, CNM

## 2017-10-18 ENCOUNTER — Ambulatory Visit (INDEPENDENT_AMBULATORY_CARE_PROVIDER_SITE_OTHER): Payer: Medicaid Other | Admitting: Certified Nurse Midwife

## 2017-10-18 ENCOUNTER — Other Ambulatory Visit: Payer: Medicaid Other

## 2017-10-18 ENCOUNTER — Other Ambulatory Visit: Payer: Self-pay | Admitting: Certified Nurse Midwife

## 2017-10-18 VITALS — BP 125/72 | HR 88 | Wt 276.3 lb

## 2017-10-18 DIAGNOSIS — Z3493 Encounter for supervision of normal pregnancy, unspecified, third trimester: Secondary | ICD-10-CM

## 2017-10-18 DIAGNOSIS — Z369 Encounter for antenatal screening, unspecified: Secondary | ICD-10-CM

## 2017-10-18 DIAGNOSIS — F419 Anxiety disorder, unspecified: Secondary | ICD-10-CM

## 2017-10-18 DIAGNOSIS — O0933 Supervision of pregnancy with insufficient antenatal care, third trimester: Secondary | ICD-10-CM

## 2017-10-18 DIAGNOSIS — F329 Major depressive disorder, single episode, unspecified: Secondary | ICD-10-CM

## 2017-10-18 MED ORDER — SERTRALINE HCL 50 MG PO TABS: 50.0000 mg | ORAL_TABLET | Freq: Every day | ORAL | 1 refills | Status: DC

## 2017-10-18 MED ORDER — HYDROXYZINE HCL 25 MG PO TABS: 25.0000 mg | ORAL_TABLET | Freq: Four times a day (QID) | ORAL | 2 refills | Status: DC | PRN

## 2017-10-18 NOTE — Patient Instructions (Addendum)
Common Medications Safe in Pregnancy  Acne:      Constipation:  Benzoyl Peroxide     Colace  Clindamycin      Dulcolax Suppository  Topica Erythromycin     Fibercon  Salicylic Acid      Metamucil         Miralax AVOID:        Senakot   Accutane    Cough:  Retin-A       Cough Drops  Tetracycline      Phenergan w/ Codeine if Rx  Minocycline      Robitussin (Plain & DM)  Antibiotics:     Crabs/Lice:  Ceclor       RID  Cephalosporins    AVOID:  E-Mycins      Kwell  Keflex  Macrobid/Macrodantin   Diarrhea:  Penicillin      Kao-Pectate  Zithromax      Imodium AD         PUSH FLUIDS AVOID:       Cipro     Fever:  Tetracycline      Tylenol (Regular or Extra  Minocycline       Strength)  Levaquin      Extra Strength-Do not          Exceed 8 tabs/24 hrs Caffeine:        <200mg/day (equiv. To 1 cup of coffee or  approx. 3 12 oz sodas)         Gas: Cold/Hayfever:       Gas-X  Benadryl      Mylicon  Claritin       Phazyme  **Claritin-D        Chlor-Trimeton    Headaches:  Dimetapp      ASA-Free Excedrin  Drixoral-Non-Drowsy     Cold Compress  Mucinex (Guaifenasin)     Tylenol (Regular or Extra  Sudafed/Sudafed-12 Hour     Strength)  **Sudafed PE Pseudoephedrine   Tylenol Cold & Sinus     Vicks Vapor Rub  Zyrtec  **AVOID if Problems With Blood Pressure         Heartburn: Avoid lying down for at least 1 hour after meals  Aciphex      Maalox     Rash:  Milk of Magnesia     Benadryl    Mylanta       1% Hydrocortisone Cream  Pepcid  Pepcid Complete   Sleep Aids:  Prevacid      Ambien   Prilosec       Benadryl  Rolaids       Chamomile Tea  Tums (Limit 4/day)     Unisom  Zantac       Tylenol PM         Warm milk-add vanilla or  Hemorrhoids:       Sugar for taste  Anusol/Anusol H.C.  (RX: Analapram 2.5%)  Sugar Substitutes:  Hydrocortisone OTC     Ok in moderation  Preparation H      Tucks        Vaseline lotion applied to tissue with  wiping    Herpes:     Throat:  Acyclovir      Oragel  Famvir  Valtrex     Vaccines:         Flu Shot Leg Cramps:       *Gardasil  Benadryl      Hepatitis A         Hepatitis B Nasal Spray:         Pneumovax  Saline Nasal Spray     Polio Booster         Tetanus Nausea:       Tuberculosis test or PPD  Vitamin B6 25 mg TID   AVOID:    Dramamine      *Gardasil  Emetrol       Live Poliovirus  Ginger Root 250 mg QID    MMR (measles, mumps &  High Complex Carbs @ Bedtime    rebella)  Sea Bands-Accupressure    Varicella (Chickenpox)  Unisom 1/2 tab TID     *No known complications           If received before Pain:         Known pregnancy;   Darvocet       Resume series after  Lortab        Delivery  Percocet    Yeast:   Tramadol      Femstat  Tylenol 3      Gyne-lotrimin  Ultram       Monistat  Vicodin           MISC:         All Sunscreens           Hair Coloring/highlights          Insect Repellant's          (Including DEET)         Mystic Tans  Perinatal Anxiety When a woman feels excessive tension or worry (anxiety) during pregnancy or during the first 12 months after she gives birth, she has a condition called perinatal anxiety. Anxiety can interfere with work, school, relationships, and other everyday activities. If it is not managed properly, it can also cause problems in the mother and her baby.  If you are pregnant and you have symptoms of an anxiety disorder, it is important to talk with your health care provider. What are the causes? The exact cause of this condition is not known. Hormonal changes during and after pregnancy may play a role in causing perinatal anxiety. What increases the risk? You are more likely to develop this condition if:  You have a personal or family history of depression, anxiety, or mood disorders.  You experience a stressful life event during pregnancy, such as the death of a loved one.  You have a lot of regular life stress, such as being  a single parent.  You have thyroid problems.  What are the signs or symptoms? Perinatal anxiety can be different for everyone. It may include:  Panic attacks (panic disorder). These are intense episodes of fear or discomfort that may also cause sweating, nausea, shortness of breath, or fear of dying. They usually last 5-15 minutes.  Reliving an upsetting (traumatic) event through distressing thoughts, dreams, or flashbacks (post-traumatic stress disorder, or PTSD).  Excessive worry about multiple problems (generalized anxiety disorder).  Fear and stress about leaving certain people or loved ones (separation anxiety).  Performing repetitive tasks (compulsions) to relieve stress or worry (obsessive compulsive disorder, or OCD).  Fear of certain objects or situations (phobias).  Excessive worrying, such as a constant feeling that something bad is going to happen.  Inability to relax.  Difficulty concentrating.  Sleep problems.  Frequent nightmares or disturbing thoughts.  How is this diagnosed? This condition is diagnosed based on a physical exam and mental evaluation. In some cases, your health care provider may use an anxiety screening tool. These tools include a list of  questions that can help a health care provider diagnose anxiety. Your health care provider may refer you to a mental health expert who specializes in anxiety. How is this treated? This condition may be treated with:  Medicines. Your health care provider will only give you medicines that have been proven safe for pregnancy and breastfeeding.  Talk therapy with a mental health professional to help change your patterns of thinking (cognitive behavioral therapy).  Mindfulness-based stress reduction.  Other relaxation therapies, such as deep breathing or guided muscle relaxation.  Support groups.  Follow these instructions at home: Lifestyle  Do not use any products that contain nicotine or tobacco, such as  cigarettes and e-cigarettes. If you need help quitting, ask your health care provider.  Do not use alcohol when you are pregnant. After your baby is born, limit alcohol intake to no more than 1 drink a day. One drink equals 12 oz of beer, 5 oz of wine, or 1 oz of hard liquor.  Consider joining a support group for new mothers. Ask your health care provider for recommendations.  Take good care of yourself. Make sure you: ? Get plenty of sleep. If you are having trouble sleeping, talk with your health care provider. ? Eat a healthy diet. This includes plenty of fruits and vegetables, whole grains, and lean proteins. ? Exercise regularly, as told by your health care provider. Ask your health care provider what exercises are safe for you. General instructions  Take over-the-counter and prescription medicines only as told by your health care provider.  Talk with your partner or family members about your feelings during pregnancy. Share any concerns or fears that you may have.  Ask for help with tasks or chores when you need it. Ask friends and family members to provide meals, watch your children, or help with cleaning.  Keep all follow-up visits as told by your health care provider. This is important. Contact a health care provider if:  You (or people close to you) notice that you have any symptoms of anxiety or depression.  You have anxiety and your symptoms get worse.  You experience side effects from medicines, such as nausea or sleep problems. Get help right away if:  You feel like hurting yourself, your baby, or someone else. If you ever feel like you may hurt yourself or others, or have thoughts about taking your own life, get help right away. You can go to your nearest emergency department or call:  Your local emergency services (911 in the [place]  A suicide crisis helpline, such as the [facility] at (669)036-5163. This is open 24 hours a  day.  Summary  Perinatal anxiety is when a woman feels excessive tension or worry during pregnancy or during the first 12 months after she gives birth.  Perinatal anxiety may include panic attacks, post-traumatic stress disorder, separation anxiety, phobias, or generalized anxiety.  Perinatal anxiety can cause physical health problems in the mother and baby if not properly managed.  This condition is treated with medicines, talk therapy, stress reduction therapies, or a combination of two or more treatments.  Talk with your partner or family members about your concerns or fears. Do not be afraid to ask for help. This information is not intended to replace advice given to you by your health care provider. Make sure you discuss any questions you have with your health care provider. Document Released: 10/21/2016 Document Revised: 10/21/2016 Document Reviewed: 10/21/2016 Elsevier Interactive Patient Education  [employer].  Hydroxyzine capsules or tablets What is this medicine? HYDROXYZINE (hye [place] i zeen) is an antihistamine. This medicine is used to treat allergy symptoms. It is also used to treat anxiety and tension. This medicine can be used with other medicines to induce sleep before surgery. This medicine may be used for other purposes; ask your health care provider or pharmacist if you have questions. COMMON BRAND NAME(S): ANX, Atarax, Rezine, Vistaril What should I tell my health care provider before I take this medicine? They need to know if you have any of these conditions: -any chronic illness -difficulty passing urine -glaucoma -heart disease -kidney disease -liver disease -lung disease -an unusual or allergic reaction to hydroxyzine, cetirizine, other medicines, foods, dyes, or preservatives -pregnant or trying to get pregnant -breast-feeding How should I use this medicine? Take this medicine by mouth with a full glass of water. Follow the directions on the  prescription label. You may take this medicine with food or on an empty stomach. Take your medicine at regular intervals. Do not take your medicine more often than directed. Talk to your pediatrician regarding the use of this medicine in children. Special care may be needed. While this drug may be prescribed for children as young as 6 years of age for selected conditions, precautions do apply. Patients over 40 years old may have a stronger reaction and need a smaller dose. Overdosage: If you think you have taken too much of this medicine contact a poison control center or emergency room at once. NOTE: This medicine is only for you. Do not share this medicine with others. What if I miss a dose? If you miss a dose, take it as soon as you can. If it is almost time for your next dose, take only that dose. Do not take double or extra doses. What may interact with this medicine? -alcohol -barbiturate medicines for sleep or seizures -medicines for colds, allergies -medicines for depression, anxiety, or emotional disturbances -medicines for pain -medicines for sleep -muscle relaxants This list may not describe all possible interactions. Give your health care provider a list of all the medicines, herbs, non-prescription drugs, or dietary supplements you use. Also tell them if you smoke, drink alcohol, or use illegal drugs. Some items may interact with your medicine. What should I watch for while using this medicine? Tell your doctor or health care professional if your symptoms do not improve. You may get drowsy or dizzy. Do not drive, use machinery, or do anything that needs mental alertness until you know how this medicine affects you. Do not stand or sit up quickly, especially if you are an older patient. This reduces the risk of dizzy or fainting spells. Alcohol may interfere with the effect of this medicine. Avoid alcoholic drinks. Your mouth may get dry. Chewing sugarless gum or sucking hard candy,  and drinking plenty of water may help. Contact your doctor if the problem does not go away or is severe. This medicine may cause dry eyes and blurred vision. If you wear contact lenses you may feel some discomfort. Lubricating drops may help. See your eye doctor if the problem does not go away or is severe. If you are receiving skin tests for allergies, tell your doctor you are using this medicine. What side effects may I notice from receiving this medicine? Side effects that you should report to your doctor or health care professional as soon as possible: -fast or irregular heartbeat -difficulty passing urine -seizures -slurred speech or confusion -tremor Side effects  that usually do not require medical attention (report to your doctor or health care professional if they continue or are bothersome): -constipation -drowsiness -fatigue -headache -stomach upset This list may not describe all possible side effects. Call your doctor for medical advice about side effects. You may report side effects to FDA at 1-800-FDA-1088. Where should I keep my medicine? Keep out of the reach of children. Store at room temperature between 15 and 30 degrees C (59 and 86 degrees F). Keep container tightly closed. Throw away any unused medicine after the expiration date. NOTE: This sheet is a summary. It may not cover all possible information. If you have questions about this medicine, talk to your doctor, pharmacist, or health care provider.  2018 Elsevier/Gold Standard (2008-01-06 14:50:59) Sertraline tablets What is this medicine? SERTRALINE (SER tra leen) is used to treat depression. It may also be used to treat obsessive compulsive disorder, panic disorder, post-trauma stress, premenstrual dysphoric disorder (PMDD) or social anxiety. This medicine may be used for other purposes; ask your health care provider or pharmacist if you have questions. COMMON BRAND NAME(S): Zoloft What should I tell my health  care provider before I take this medicine? They need to know if you have any of these conditions: -bleeding disorders -bipolar disorder or a family history of bipolar disorder -glaucoma -heart disease -high blood pressure -history of irregular heartbeat -history of low levels of calcium, magnesium, or potassium in the blood -if you often drink alcohol -liver disease -receiving electroconvulsive therapy -seizures -suicidal thoughts, plans, or attempt; a previous suicide attempt by you or a family member -take medicines that treat or prevent blood clots -thyroid disease -an unusual or allergic reaction to sertraline, other medicines, foods, dyes, or preservatives -pregnant or trying to get pregnant -breast-feeding How should I use this medicine? Take this medicine by mouth with a glass of water. Follow the directions on the prescription label. You can take it with or without food. Take your medicine at regular intervals. Do not take your medicine more often than directed. Do not stop taking this medicine suddenly except upon the advice of your doctor. Stopping this medicine too quickly may cause serious side effects or your condition may worsen. A special MedGuide will be given to you by the pharmacist with each prescription and refill. Be sure to read this information carefully each time. Talk to your pediatrician regarding the use of this medicine in children. While this drug may be prescribed for children as young as 7 years for selected conditions, precautions do apply. Overdosage: If you think you have taken too much of this medicine contact a poison control center or emergency room at once. NOTE: This medicine is only for you. Do not share this medicine with others. What if I miss a dose? If you miss a dose, take it as soon as you can. If it is almost time for your next dose, take only that dose. Do not take double or extra doses. What may interact with this medicine? Do not take this  medicine with any of the following medications: -cisapride -dofetilide -dronedarone -linezolid -MAOIs like Carbex, Eldepryl, Marplan, Nardil, and Parnate -methylene blue (injected into a vein) -pimozide -thioridazine This medicine may also interact with the following medications: -alcohol -amphetamines -aspirin and aspirin-like medicines -certain medicines for depression, anxiety, or psychotic disturbances -certain medicines for fungal infections like ketoconazole, fluconazole, posaconazole, and itraconazole -certain medicines for irregular heart beat like flecainide, quinidine, propafenone -certain medicines for migraine headaches like almotriptan, eletriptan, frovatriptan, naratriptan,  rizatriptan, sumatriptan, zolmitriptan -certain medicines for sleep -certain medicines for seizures like carbamazepine, valproic acid, phenytoin -certain medicines that treat or prevent blood clots like warfarin, enoxaparin, dalteparin -cimetidine -digoxin -diuretics -fentanyl -isoniazid -lithium -NSAIDs, medicines for pain and inflammation, like ibuprofen or naproxen -other medicines that prolong the QT interval (cause an abnormal heart rhythm) -rasagiline -safinamide -supplements like St. John's wort, kava kava, valerian -tolbutamide -tramadol -tryptophan This list may not describe all possible interactions. Give your health care provider a list of all the medicines, herbs, non-prescription drugs, or dietary supplements you use. Also tell them if you smoke, drink alcohol, or use illegal drugs. Some items may interact with your medicine. What should I watch for while using this medicine? Tell your doctor if your symptoms do not get better or if they get worse. Visit your doctor or health care professional for regular checks on your progress. Because it may take several weeks to see the full effects of this medicine, it is important to continue your treatment as prescribed by your  doctor. Patients and their families should watch out for new or worsening thoughts of suicide or depression. Also watch out for sudden changes in feelings such as feeling anxious, agitated, panicky, irritable, hostile, aggressive, impulsive, severely restless, overly excited and hyperactive, or not being able to sleep. If this happens, especially at the beginning of treatment or after a change in dose, call your health care professional. Dennis Bast may get drowsy or dizzy. Do not drive, use machinery, or do anything that needs mental alertness until you know how this medicine affects you. Do not stand or sit up quickly, especially if you are an older patient. This reduces the risk of dizzy or fainting spells. Alcohol may interfere with the effect of this medicine. Avoid alcoholic drinks. Your mouth may get dry. Chewing sugarless gum or sucking hard candy, and drinking plenty of water may help. Contact your doctor if the problem does not go away or is severe. What side effects may I notice from receiving this medicine? Side effects that you should report to your doctor or health care professional as soon as possible: -allergic reactions like skin rash, itching or hives, swelling of the face, lips, or tongue -anxious -black, tarry stools -changes in vision -confusion -elevated mood, decreased need for sleep, racing thoughts, impulsive behavior -eye pain -fast, irregular heartbeat -feeling faint or lightheaded, falls -feeling agitated, angry, or irritable -hallucination, loss of contact with reality -loss of balance or coordination -loss of memory -painful or prolonged erections -restlessness, pacing, inability to keep still -seizures -stiff muscles -suicidal thoughts or other mood changes -trouble sleeping -unusual bleeding or bruising -unusually weak or tired -vomiting Side effects that usually do not require medical attention (report to your doctor or health care professional if they continue or  are bothersome): -change in appetite or weight -change in sex drive or performance -diarrhea -increased sweating -indigestion, nausea -tremors This list may not describe all possible side effects. Call your doctor for medical advice about side effects. You may report side effects to FDA at 1-800-FDA-1088. Where should I keep my medicine? Keep out of the reach of children. Store at room temperature between 15 and 30 degrees C (59 and 86 degrees F). Throw away any unused medicine after the expiration date. NOTE: This sheet is a summary. It may not cover all possible information. If you have questions about this medicine, talk to your doctor, pharmacist, or health care provider.  2018 Elsevier/Gold Standard (2016-08-28 14:17:49)

## 2017-10-18 NOTE — Progress Notes (Signed)
ROB- c/o cough, runny nose, is taking some meds for this, pt is having some pelvic pressure

## 2017-10-18 NOTE — Progress Notes (Signed)
ROB-Reports improving nasal congestion and cough. Aware of multiple missed/skipped appointments. Feeling depressed and endorses difficulty completing tasks of daily living. PHQ-9: 18. Denies SI/HI. Rx: Zoloft and Vistaril, see orders. Contact PCM, Dorena DewMarilyn Villarreal, and she placed referral to therapist. Body mass index is 45.98 kg/m., encouraged patient to schedule Anesthesia consult; note faxed and copy given to patient. Patient aware one (1) hour glucola elevated and understands need for 3 hr GTT. She promised to return this week for lab only visit. Reviewed red flag symptoms and when to call. RTC x 1 week for Growth US due to morbid obesity and ROB with Pattricia Bossnnie or sooner if needed.    Depression screen PHQ 2/9 10/18/2017  Decreased Interest 3  Down, Depressed, Hopeless 3  PHQ - 2 Score 6  Altered sleeping 1  Tired, decreased energy 3  Change in appetite 3  Feeling bad or failure about yourself  3  Trouble concentrating 2  Moving slowly or fidgety/restless 0  Suicidal thoughts 0  PHQ-9 Score 18  Difficult doing work/chores Very difficult

## 2017-10-19 ENCOUNTER — Other Ambulatory Visit: Payer: Medicaid Other

## 2017-10-19 DIAGNOSIS — F329 Major depressive disorder, single episode, unspecified: Secondary | ICD-10-CM | POA: Insufficient documentation

## 2017-10-19 DIAGNOSIS — F419 Anxiety disorder, unspecified: Secondary | ICD-10-CM | POA: Insufficient documentation

## 2017-10-21 ENCOUNTER — Other Ambulatory Visit: Payer: Medicaid Other

## 2017-10-21 DIAGNOSIS — Z3493 Encounter for supervision of normal pregnancy, unspecified, third trimester: Secondary | ICD-10-CM

## 2017-10-22 LAB — GESTATIONAL GLUCOSE TOLERANCE
GLUCOSE FASTING: 100 mg/dL — AB (ref 65–94)
Glucose, GTT - 1 Hour: 198 mg/dL — ABNORMAL HIGH (ref 65–179)
Glucose, GTT - 2 Hour: 139 mg/dL (ref 65–154)
Glucose, GTT - 3 Hour: 96 mg/dL (ref 65–139)

## 2017-10-22 NOTE — Progress Notes (Signed)
Please contact patient. Positive for GDM. Send info via MyChart and mail regarding Gestational Diabetes. Needs referral to [facility] ASAP. JML

## 2017-10-25 ENCOUNTER — Inpatient Hospital Stay: Admission: RE | Admit: 2017-10-25 | Payer: Medicaid Other | Source: Ambulatory Visit

## 2017-10-25 ENCOUNTER — Ambulatory Visit (INDEPENDENT_AMBULATORY_CARE_PROVIDER_SITE_OTHER): Payer: Medicaid Other | Admitting: Certified Nurse Midwife

## 2017-10-25 ENCOUNTER — Ambulatory Visit (INDEPENDENT_AMBULATORY_CARE_PROVIDER_SITE_OTHER): Payer: Medicaid Other

## 2017-10-25 VITALS — BP 96/66 | HR 91 | Wt 274.2 lb

## 2017-10-25 DIAGNOSIS — O24419 Gestational diabetes mellitus in pregnancy, unspecified control: Secondary | ICD-10-CM

## 2017-10-25 DIAGNOSIS — Z3493 Encounter for supervision of normal pregnancy, unspecified, third trimester: Secondary | ICD-10-CM

## 2017-10-25 DIAGNOSIS — Z369 Encounter for antenatal screening, unspecified: Secondary | ICD-10-CM

## 2017-10-25 DIAGNOSIS — Z3483 Encounter for supervision of other normal pregnancy, third trimester: Secondary | ICD-10-CM | POA: Diagnosis not present

## 2017-10-25 LAB — POCT URINALYSIS DIPSTICK
Bilirubin, UA: NEGATIVE
Blood, UA: NEGATIVE
Glucose, UA: NEGATIVE
KETONES UA: NEGATIVE
Leukocytes, UA: NEGATIVE
NITRITE UA: NEGATIVE
Protein, UA: NEGATIVE
Spec Grav, UA: 1.01 (ref 1.010–1.025)
Urobilinogen, UA: 0.2 E.U./dL
pH, UA: 7 (ref 5.0–8.0)

## 2017-10-25 NOTE — Progress Notes (Signed)
Pt is here for an ROB visit.Is due for cultures. 

## 2017-10-25 NOTE — Patient Instructions (Addendum)
Braxton Hicks Contractions Contractions of the uterus can occur throughout pregnancy, but they are not always a sign that you are in labor. You may have practice contractions called Braxton Hicks contractions. These false labor contractions are sometimes confused with true labor. What are Braxton Hicks contractions? Braxton Hicks contractions are tightening movements that occur in the muscles of the uterus before labor. Unlike true labor contractions, these contractions do not result in opening (dilation) and thinning of the cervix. Toward the end of pregnancy (32-34 weeks), Braxton Hicks contractions can happen more often and may become stronger. These contractions are sometimes difficult to tell apart from true labor because they can be very uncomfortable. You should not feel embarrassed if you go to the hospital with false labor. Sometimes, the only way to tell if you are in true labor is for your health care provider to look for changes in the cervix. The health care provider will do a physical exam and may monitor your contractions. If you are not in true labor, the exam should show that your cervix is not dilating and your water has not broken. If there are other health problems associated with your pregnancy, it is completely safe for you to be sent home with false labor. You may continue to have Braxton Hicks contractions until you go into true labor. How to tell the difference between true labor and false labor True labor  Contractions last 30-70 seconds.  Contractions become very regular.  Discomfort is usually felt in the top of the uterus, and it spreads to the lower abdomen and low back.  Contractions do not go away with walking.  Contractions usually become more intense and increase in frequency.  The cervix dilates and gets thinner. False labor  Contractions are usually shorter and not as strong as true labor contractions.  Contractions are usually irregular.  Contractions  are often felt in the front of the lower abdomen and in the groin.  Contractions may go away when you walk around or change positions while lying down.  Contractions get weaker and are shorter-lasting as time goes on.  The cervix usually does not dilate or become thin. Follow these instructions at home:  Take over-the-counter and prescription medicines only as told by your health care provider.  Keep up with your usual exercises and follow other instructions from your health care provider.  Eat and drink lightly if you think you are going into labor.  If Braxton Hicks contractions are making you uncomfortable: ? Change your position from lying down or resting to walking, or change from walking to resting. ? Sit and rest in a tub of warm water. ? Drink enough fluid to keep your urine pale yellow. Dehydration may cause these contractions. ? Do slow and deep breathing several times an hour.  Keep all follow-up prenatal visits as told by your health care provider. This is important. Contact a health care provider if:  You have a fever.  You have continuous pain in your abdomen. Get help right away if:  Your contractions become stronger, more regular, and closer together.  You have fluid leaking or gushing from your vagina.  You pass blood-tinged mucus (bloody show).  You have bleeding from your vagina.  You have low back pain that you never had before.  You feel your baby's head pushing down and causing pelvic pressure.  Your baby is not moving inside you as much as it used to. Summary  Contractions that occur before labor are called Braxton   Hicks contractions, false labor, or practice contractions.  Braxton Hicks contractions are usually shorter, weaker, farther apart, and less regular than true labor contractions. True labor contractions usually become progressively stronger and regular and they become more frequent.  Manage discomfort from [facility] by  changing position, resting in a warm bath, drinking plenty of water, or practicing deep breathing. This information is not intended to replace advice given to you by your health care provider. Make sure you discuss any questions you have with your health care provider. Document Released: 01/07/2017 Document Revised: 01/07/2017 Document Reviewed: 01/07/2017 Elsevier Interactive Patient Education  2018 [employer]  Gestational Diabetes Mellitus, Diagnosis Gestational diabetes (gestational diabetes mellitus) is a short-term (temporary) form of diabetes that can happen during pregnancy. It goes away after you give birth. It may be caused by one or both of these problems:  Your body does not make enough of a hormone called insulin.  Your body does not respond in a normal way to insulin that it makes.  Insulin lets sugars (glucose) go into cells in the body. This gives you energy. If you have diabetes, sugars cannot get into cells. This causes high blood sugar (hyperglycemia). If diabetes is treated, it may not hurt you or your baby. Your doctor will set treatment goals for you. In general, you should have these blood sugar levels:  After not eating for a long time (fasting): 95 mg/dL (5.3 mmol/L).  After meals (postprandial): ? One hour after a meal: at or below 140 mg/dL (7.8 mmol/L). ? Two hours after a meal: at or below 120 mg/dL (6.7 mmol/L).  A1c (hemoglobin A1c) level: 6-6.5%.  Follow these instructions at home: Questions to Ask Your Doctor  You may want to ask these questions:  Do I need to meet with a diabetes educator?  Where can I find a support group for people with diabetes?  What equipment will I need to care for myself at home?  What diabetes medicines do I need? When should I take them?  How often do I need to check my blood sugar?  What number can I call if I have questions?  When is my next doctor's visit?  General instructions  Take over-the-counter and  prescription medicines only as told by your doctor.  Stay at a healthy weight during pregnancy.  Keep all follow-up visits as told by your doctor. This is important. Contact a doctor if:  Your blood sugar is at or above 240 mg/dL (40.913.3 mmol/L).  Your blood sugar is at or above 200 mg/dL (81.111.1 mmol/L) and you have ketones in your pee (urine).  You have been sick or have had a fever for 2 days or more and you are not getting better.  You have any of these problems for more than 6 hours: ? You cannot eat or drink. ? You feel sick to your stomach (nauseous). ? You throw up (vomit). ? You have watery poop (diarrhea). Get help right away if:  Your blood sugar is lower than 54 mg/dL (3 mmol/L).  You get confused.  You have trouble: ? Thinking clearly. ? Breathing.  Your baby moves less than normal.  You have: ? Moderate or large ketone levels in your pee (urine). ? Bleeding from your vagina. ? Unusual fluid coming from your vagina. ? Early contractions. These may feel like tightness in your belly. This information is not intended to replace advice given to you by your health care provider. Make sure you discuss any  questions you have with your health care provider. Document Released: 12/16/2015 Document Revised: 01/30/2016 Document Reviewed: 09/27/2015 Elsevier Interactive Patient Education  [employer].

## 2017-10-25 NOTE — Progress Notes (Signed)
ROB, discussed glucose results and diagnosis of gestational diabetes. Informed about life styles class. She verbalizes understanding and agrees to plan. She has anesthesia consult today but is unsure she can make it due to her kids are sick. . Discussed ultrasound results today. She verbalizes understanding . ROB with NST 1 wk.   Doreene BurkeAnnie Tyrail Grandfield, CNM   ULTRASOUND REPORT  Location: [facility] Date of Service:  10/25/2017  Indications: Growth Findings:  Singleton intrauterine pregnancy is visualized with FHR at 155 BPM. Biometrics give an (U/S) Gestational age of 25 5/7 weeks and an (U/S) EDD of 11/17/17; this correlates with the clinically established EDD of 11/18/17.  Fetal presentation is vertex.  EFW: 2923 grams (6lb 7oz).  46th percentile. Placenta: Anterior and grade 2. AFI: 16.1 cm.  Fetal stomach, kidneys, and bladder appear WNL.  Impression: 1. 36 5/7 week Viable Singleton Intrauterine pregnancy by U/S. 2. (U/S) EDD is consistent with Clinically established (LMP) EDD of 11/18/17. 3. EFW: 2923 grams (6lb 7oz).  46th percentile.  Recommendations: 1.Clinical correlation with the patient's History and Physical Exam.

## 2017-10-27 LAB — GC/CHLAMYDIA PROBE AMP
Chlamydia trachomatis, NAA: NEGATIVE
Neisseria gonorrhoeae by PCR: NEGATIVE

## 2017-10-29 ENCOUNTER — Encounter: Payer: Self-pay | Admitting: Certified Nurse Midwife

## 2017-10-29 LAB — STREP GP B CULTURE+RFLX: Strep Gp B Culture+Rflx: NEGATIVE

## 2017-11-01 ENCOUNTER — Ambulatory Visit (INDEPENDENT_AMBULATORY_CARE_PROVIDER_SITE_OTHER): Payer: Medicaid Other | Admitting: Certified Nurse Midwife

## 2017-11-01 ENCOUNTER — Other Ambulatory Visit: Payer: Medicaid Other

## 2017-11-01 ENCOUNTER — Inpatient Hospital Stay: Admission: RE | Admit: 2017-11-01 | Payer: Medicaid Other | Source: Ambulatory Visit

## 2017-11-01 DIAGNOSIS — Z3483 Encounter for supervision of other normal pregnancy, third trimester: Secondary | ICD-10-CM

## 2017-11-01 LAB — POCT URINALYSIS DIPSTICK
Bilirubin, UA: NEGATIVE
Blood, UA: NEGATIVE
GLUCOSE UA: NEGATIVE
Ketones, UA: NEGATIVE
LEUKOCYTES UA: NEGATIVE
Nitrite, UA: NEGATIVE
PROTEIN UA: NEGATIVE
Spec Grav, UA: 1.02 (ref 1.010–1.025)
Urobilinogen, UA: 0.2 E.U./dL
pH, UA: 7 (ref 5.0–8.0)

## 2017-11-02 ENCOUNTER — Other Ambulatory Visit: Payer: Medicaid Other

## 2017-11-02 ENCOUNTER — Inpatient Hospital Stay: Admission: RE | Admit: 2017-11-02 | Payer: Medicaid Other | Source: Ambulatory Visit

## 2017-11-02 ENCOUNTER — Encounter: Payer: Medicaid Other | Admitting: Certified Nurse Midwife

## 2017-11-05 ENCOUNTER — Observation Stay
Admission: EM | Admit: 2017-11-05 | Discharge: 2017-11-05 | Disposition: A | Discharge status: Home / Self Care | Payer: Medicaid Other | Attending: Certified Nurse Midwife | Admitting: Certified Nurse Midwife

## 2017-11-05 ENCOUNTER — Ambulatory Visit: Payer: Medicaid Other | Admitting: *Deleted

## 2017-11-05 DIAGNOSIS — O99343 Other mental disorders complicating pregnancy, third trimester: Secondary | ICD-10-CM | POA: Insufficient documentation

## 2017-11-05 DIAGNOSIS — Z882 Allergy status to sulfonamides status: Secondary | ICD-10-CM | POA: Insufficient documentation

## 2017-11-05 DIAGNOSIS — F329 Major depressive disorder, single episode, unspecified: Secondary | ICD-10-CM | POA: Insufficient documentation

## 2017-11-05 DIAGNOSIS — O9989 Other specified diseases and conditions complicating pregnancy, childbirth and the puerperium: Principal | ICD-10-CM | POA: Insufficient documentation

## 2017-11-05 DIAGNOSIS — Z3A38 38 weeks gestation of pregnancy: Secondary | ICD-10-CM | POA: Insufficient documentation

## 2017-11-05 DIAGNOSIS — F419 Anxiety disorder, unspecified: Secondary | ICD-10-CM | POA: Insufficient documentation

## 2017-11-05 DIAGNOSIS — Z79899 Other long term (current) drug therapy: Secondary | ICD-10-CM | POA: Insufficient documentation

## 2017-11-05 DIAGNOSIS — M549 Dorsalgia, unspecified: Secondary | ICD-10-CM

## 2017-11-05 DIAGNOSIS — E669 Obesity, unspecified: Secondary | ICD-10-CM | POA: Insufficient documentation

## 2017-11-05 DIAGNOSIS — Z880 Allergy status to penicillin: Secondary | ICD-10-CM | POA: Insufficient documentation

## 2017-11-05 DIAGNOSIS — O24419 Gestational diabetes mellitus in pregnancy, unspecified control: Secondary | ICD-10-CM | POA: Insufficient documentation

## 2017-11-05 DIAGNOSIS — O0933 Supervision of pregnancy with insufficient antenatal care, third trimester: Secondary | ICD-10-CM | POA: Insufficient documentation

## 2017-11-05 DIAGNOSIS — O99213 Obesity complicating pregnancy, third trimester: Secondary | ICD-10-CM | POA: Diagnosis not present

## 2017-11-05 HISTORY — DX: Anxiety disorder, unspecified: F41.9

## 2017-11-05 HISTORY — DX: Depression, unspecified: F32.A

## 2017-11-05 HISTORY — DX: Major depressive disorder, single episode, unspecified: F32.9

## 2017-11-05 LAB — URINE DRUG SCREEN, QUALITATIVE (ARMC ONLY)
Amphetamines, Ur Screen: NOT DETECTED
BARBITURATES, UR SCREEN: NOT DETECTED
Benzodiazepine, Ur Scrn: NOT DETECTED
CANNABINOID 50 NG, UR ~~LOC~~: NOT DETECTED
COCAINE METABOLITE, UR ~~LOC~~: NOT DETECTED
MDMA (Ecstasy)Ur Screen: NOT DETECTED
Methadone Scn, Ur: NOT DETECTED
Opiate, Ur Screen: NOT DETECTED
Phencyclidine (PCP) Ur S: NOT DETECTED
TRICYCLIC, UR SCREEN: NOT DETECTED

## 2017-11-05 LAB — URINALYSIS, COMPLETE (UACMP) WITH MICROSCOPIC
BACTERIA UA: NONE SEEN
Bilirubin Urine: NEGATIVE
Glucose, UA: NEGATIVE mg/dL
HGB URINE DIPSTICK: NEGATIVE
Ketones, ur: NEGATIVE mg/dL
Nitrite: NEGATIVE
Protein, ur: NEGATIVE mg/dL
RBC / HPF: NONE SEEN RBC/hpf (ref 0–5)
SPECIFIC GRAVITY, URINE: 1.023 (ref 1.005–1.030)
pH: 5 (ref 5.0–8.0)

## 2017-11-05 LAB — HEMOGLOBIN A1C
HEMOGLOBIN A1C: 5.5 % (ref 4.8–5.6)
MEAN PLASMA GLUCOSE: 111.15 mg/dL

## 2017-11-05 MED ORDER — ACETAMINOPHEN 500 MG PO TABS: 1000.0000 mg | ORAL_TABLET | Freq: Four times a day (QID) | ORAL | Status: DC | PRN

## 2017-11-05 NOTE — OB Triage Note (Signed)
Pt presents to [facility] with back pain "all week, but contsant today" and "some abdominal pain." No LOF, vaginal bleeding, reports positive fetla movement.

## 2017-11-07 NOTE — Progress Notes (Signed)
ROB & NST-Patient roomed late for visit due to nursing error. NST performed today was reviewed Baseline 140 with Moderate variability; patient removed monitors and left office due to family emergency. Will call back to reschedule visit.

## 2017-11-07 NOTE — Discharge Summary (Signed)
Obstetric Discharge Summary  Patient ID: Marcia Villarreal MRN: [id number] DOB/AGE: 29-Jan-1991 26 y.o.   Date of Admission: 11/05/2017 Marcia Villarreal, CNM [facility](D. Evans, MD)  Date of Discharge: 11/05/2017 Marcia Villarreal, CNM [facility](D. Evans, MD)  Admitting Diagnosis: Observation at 5236w1d  Secondary Diagnosis: History of positive drug screen, Limited prenatal care, Anxiety and Depression, Gestational diabetes, Obesity in pregnancy  Discharge Diagnosis: No other diagnosis   Antepartum Procedures: NST   Brief Hospital Course   L&D OB Triage Note  Marcia Villarreal is a 27 y.o. 122P1001 female at 6636w1d, EDD Estimated Date of Delivery: 11/18/17 who presented to triage for complaints of back pain for the last week, that became constant today.  She was evaluated by the nurses with no significant findings for labor or fetal distress. Vital signs stable. An NST was performed and has been reviewed by CNM.     NST INTERPRETATION: Indications: rule out uterine contractions  Mode: External Baseline Rate (A): 143 bpm(fht)  Moderate variability Acceleration Present Decelerations none Contractions: None   Impression: reactive  Dilation: 3 Effacement (%): 60, 50 Cervical Position: Posterior Station: -3, -2 Presentation: Vertex Exam by:: Blane Oharaose Fairley, RN  Plan: NST performed was reviewed and was found to be reactive. She was discharged home with bleeding/labor precautions and daily kick counts.  Continue routine prenatal care. Patient to call Monday to schedule NST and office visit. Several open times given for Monday, but patient declined. Anesthesia consult for bedside consult. Advised consult unnecessary if patient to be induced at 39 weeks and BMI unlikely to exceed 50 by that time.    Discharge Instructions: Per After Visit Summary.  Activity: Refer to After Visit Summary  Diet: Regular  Medications:  Allergies as of 11/05/2017      Reactions   Penicillins    Sulfa Antibiotics        Medication List    ASK your doctor about these medications   hydrOXYzine 25 MG tablet Commonly known as:  ATARAX/VISTARIL Take 1 tablet (25 mg total) by mouth every 6 (six) hours as needed for anxiety.   PROVIDA DHA 16-16-1.25-110 MG Caps Take 1 capsule by mouth daily.   sertraline 50 MG tablet Commonly known as:  ZOLOFT Take 1 tablet (50 mg total) by mouth daily.      Outpatient follow up:  Follow-up Information    [facility]   Why:  please schedule follow up in the office  Contact information: [address]  [address] [place] [id number] [phone]         Postpartum contraception: Uncertain  Discharged Condition: stable  Discharged to: home   Gunnar BullaJenkins Michelle Villarreal, CNM [facility], [facility]

## 2017-11-11 ENCOUNTER — Other Ambulatory Visit: Payer: Medicaid Other

## 2017-11-11 ENCOUNTER — Other Ambulatory Visit: Payer: Self-pay

## 2017-11-11 ENCOUNTER — Inpatient Hospital Stay: Payer: Medicaid Other | Admitting: Anesthesiology

## 2017-11-11 ENCOUNTER — Inpatient Hospital Stay
Admission: EM | Admit: 2017-11-11 | Discharge: 2017-11-12 | DRG: 807 | Disposition: A | Discharge status: Home / Self Care | Payer: Medicaid Other | Attending: Obstetrics and Gynecology | Admitting: Obstetrics and Gynecology

## 2017-11-11 ENCOUNTER — Encounter: Payer: Medicaid Other | Admitting: Certified Nurse Midwife

## 2017-11-11 DIAGNOSIS — F329 Major depressive disorder, single episode, unspecified: Secondary | ICD-10-CM | POA: Diagnosis present

## 2017-11-11 DIAGNOSIS — F419 Anxiety disorder, unspecified: Secondary | ICD-10-CM | POA: Diagnosis present

## 2017-11-11 DIAGNOSIS — Z880 Allergy status to penicillin: Secondary | ICD-10-CM

## 2017-11-11 DIAGNOSIS — Z3A39 39 weeks gestation of pregnancy: Secondary | ICD-10-CM | POA: Diagnosis not present

## 2017-11-11 DIAGNOSIS — O99344 Other mental disorders complicating childbirth: Secondary | ICD-10-CM | POA: Diagnosis present

## 2017-11-11 DIAGNOSIS — Z3483 Encounter for supervision of other normal pregnancy, third trimester: Secondary | ICD-10-CM | POA: Diagnosis present

## 2017-11-11 LAB — TYPE AND SCREEN
ABO/RH(D): AB POS
ANTIBODY SCREEN: NEGATIVE

## 2017-11-11 LAB — CBC
HCT: 37.5 % (ref 35.0–47.0)
Hemoglobin: 12.5 g/dL (ref 12.0–16.0)
MCH: 29.4 pg (ref 26.0–34.0)
MCHC: 33.4 g/dL (ref 32.0–36.0)
MCV: 88 fL (ref 80.0–100.0)
PLATELETS: 184 10*3/uL (ref 150–440)
RBC: 4.26 MIL/uL (ref 3.80–5.20)
RDW: 15.3 % — AB (ref 11.5–14.5)
WBC: 8.8 10*3/uL (ref 3.6–11.0)

## 2017-11-11 LAB — INFLUENZA PANEL BY PCR (TYPE A & B)
Influenza A By PCR: POSITIVE — AB
Influenza B By PCR: NEGATIVE

## 2017-11-11 LAB — URINE DRUG SCREEN, QUALITATIVE (ARMC ONLY)
Amphetamines, Ur Screen: NOT DETECTED
BENZODIAZEPINE, UR SCRN: NOT DETECTED
Barbiturates, Ur Screen: NOT DETECTED
CANNABINOID 50 NG, UR ~~LOC~~: NOT DETECTED
Cocaine Metabolite,Ur ~~LOC~~: NOT DETECTED
MDMA (Ecstasy)Ur Screen: NOT DETECTED
METHADONE SCREEN, URINE: NOT DETECTED
Opiate, Ur Screen: NOT DETECTED
Phencyclidine (PCP) Ur S: NOT DETECTED
TRICYCLIC, UR SCREEN: NOT DETECTED

## 2017-11-11 MED ORDER — TERBUTALINE SULFATE 1 MG/ML IJ SOLN: 0.2500 mg | Freq: Once | INTRAMUSCULAR | Status: DC | PRN

## 2017-11-11 MED ORDER — LACTATED RINGERS IV SOLN
500.0000 mL | INTRAVENOUS | Status: DC | PRN
  Administered 2017-11-11 (×3): 500 mL via INTRAVENOUS

## 2017-11-11 MED ORDER — DIBUCAINE 1 % RE OINT: 1.0000 "application " | TOPICAL_OINTMENT | RECTAL | Status: DC | PRN

## 2017-11-11 MED ORDER — LACTATED RINGERS IV SOLN: 500.0000 mL | Freq: Once | INTRAVENOUS | Status: AC

## 2017-11-11 MED ORDER — BUTORPHANOL TARTRATE 1 MG/ML IJ SOLN
1.0000 mg | INTRAMUSCULAR | Status: DC | PRN
  Administered 2017-11-11: 1 mg via INTRAVENOUS
  Filled 2017-11-11: qty 1

## 2017-11-11 MED ORDER — AMMONIA AROMATIC IN INHA
RESPIRATORY_TRACT | Status: AC
  Filled 2017-11-11: qty 10

## 2017-11-11 MED ORDER — GUAIFENESIN 100 MG/5ML PO SOLN
5.0000 mL | ORAL | Status: DC | PRN
  Administered 2017-11-11: 100 mg via ORAL
  Filled 2017-11-11 (×2): qty 10

## 2017-11-11 MED ORDER — IBUPROFEN 600 MG PO TABS
600.0000 mg | ORAL_TABLET | Freq: Four times a day (QID) | ORAL | Status: DC
  Administered 2017-11-11: 600 mg via ORAL
  Filled 2017-11-11 (×3): qty 1

## 2017-11-11 MED ORDER — TETANUS-DIPHTH-ACELL PERTUSSIS 5-2.5-18.5 LF-MCG/0.5 IM SUSP: 0.5000 mL | Freq: Once | INTRAMUSCULAR | Status: DC

## 2017-11-11 MED ORDER — MISOPROSTOL 200 MCG PO TABS
ORAL_TABLET | ORAL | Status: AC
  Filled 2017-11-11: qty 4

## 2017-11-11 MED ORDER — BUPIVACAINE HCL (PF) 0.25 % IJ SOLN
INTRAMUSCULAR | Status: DC | PRN
  Administered 2017-11-11 (×3): 5 mL via EPIDURAL

## 2017-11-11 MED ORDER — LACTATED RINGERS IV SOLN
INTRAVENOUS | Status: DC
  Administered 2017-11-11: 06:00:00 via INTRAVENOUS

## 2017-11-11 MED ORDER — FERROUS SULFATE 325 (65 FE) MG PO TABS
325.0000 mg | ORAL_TABLET | Freq: Every day | ORAL | Status: DC
  Administered 2017-11-12: 325 mg via ORAL
  Filled 2017-11-11 (×2): qty 1

## 2017-11-11 MED ORDER — OXYCODONE-ACETAMINOPHEN 5-325 MG PO TABS: 2.0000 | ORAL_TABLET | ORAL | Status: DC | PRN

## 2017-11-11 MED ORDER — DOCUSATE SODIUM 100 MG PO CAPS
100.0000 mg | ORAL_CAPSULE | Freq: Two times a day (BID) | ORAL | Status: DC
  Administered 2017-11-11: 100 mg via ORAL
  Filled 2017-11-11 (×2): qty 1

## 2017-11-11 MED ORDER — LIDOCAINE HCL (PF) 1 % IJ SOLN
INTRAMUSCULAR | Status: DC | PRN
  Administered 2017-11-11: 3 mL

## 2017-11-11 MED ORDER — SODIUM CHLORIDE 0.9 % IV SOLN: INTRAVENOUS | Status: DC

## 2017-11-11 MED ORDER — OXYTOCIN 10 UNIT/ML IJ SOLN
INTRAMUSCULAR | Status: AC
  Filled 2017-11-11: qty 2

## 2017-11-11 MED ORDER — WITCH HAZEL-GLYCERIN EX PADS: 1.0000 "application " | MEDICATED_PAD | CUTANEOUS | Status: DC | PRN

## 2017-11-11 MED ORDER — ONDANSETRON HCL 4 MG/2ML IJ SOLN: 4.0000 mg | Freq: Four times a day (QID) | INTRAMUSCULAR | Status: DC | PRN

## 2017-11-11 MED ORDER — OXYTOCIN 40 UNITS IN LACTATED RINGERS INFUSION - SIMPLE MED
1.0000 m[IU]/min | INTRAVENOUS | Status: DC
  Administered 2017-11-11: 1 m[IU]/min via INTRAVENOUS

## 2017-11-11 MED ORDER — ONDANSETRON HCL 4 MG/2ML IJ SOLN: 4.0000 mg | INTRAMUSCULAR | Status: DC | PRN

## 2017-11-11 MED ORDER — FENTANYL 2.5 MCG/ML W/ROPIVACAINE 0.15% IN NS 100 ML EPIDURAL (ARMC)
12.0000 mL/h | EPIDURAL | Status: DC
  Administered 2017-11-11: 12 mL/h via EPIDURAL

## 2017-11-11 MED ORDER — OXYTOCIN BOLUS FROM INFUSION
500.0000 mL | Freq: Once | INTRAVENOUS | Status: AC
  Administered 2017-11-11: 500 mL via INTRAVENOUS

## 2017-11-11 MED ORDER — OXYCODONE-ACETAMINOPHEN 5-325 MG PO TABS: 1.0000 | ORAL_TABLET | ORAL | Status: DC | PRN

## 2017-11-11 MED ORDER — GUAIFENESIN ER 600 MG PO TB12
600.0000 mg | ORAL_TABLET | Freq: Two times a day (BID) | ORAL | Status: DC | PRN
  Administered 2017-11-11 – 2017-11-12 (×2): 600 mg via ORAL
  Filled 2017-11-11 (×3): qty 1

## 2017-11-11 MED ORDER — PRENATAL MULTIVITAMIN CH
1.0000 | ORAL_TABLET | Freq: Every day | ORAL | Status: DC
  Filled 2017-11-11: qty 1

## 2017-11-11 MED ORDER — SOD CITRATE-CITRIC ACID 500-334 MG/5ML PO SOLN: 30.0000 mL | ORAL | Status: DC | PRN

## 2017-11-11 MED ORDER — BENZOCAINE-MENTHOL 20-0.5 % EX AERO: 1.0000 "application " | INHALATION_SPRAY | CUTANEOUS | Status: DC | PRN

## 2017-11-11 MED ORDER — LIDOCAINE HCL (PF) 1 % IJ SOLN
INTRAMUSCULAR | Status: AC
  Filled 2017-11-11: qty 30

## 2017-11-11 MED ORDER — EPHEDRINE 5 MG/ML INJ
10.0000 mg | INTRAVENOUS | Status: DC | PRN
  Filled 2017-11-11: qty 2

## 2017-11-11 MED ORDER — PHENYLEPHRINE 40 MCG/ML (10ML) SYRINGE FOR IV PUSH (FOR BLOOD PRESSURE SUPPORT)
80.0000 ug | PREFILLED_SYRINGE | INTRAVENOUS | Status: DC | PRN
  Filled 2017-11-11: qty 5

## 2017-11-11 MED ORDER — OXYTOCIN 40 UNITS IN LACTATED RINGERS INFUSION - SIMPLE MED
2.5000 [IU]/h | INTRAVENOUS | Status: DC
  Filled 2017-11-11: qty 1000

## 2017-11-11 MED ORDER — SENNOSIDES-DOCUSATE SODIUM 8.6-50 MG PO TABS: 2.0000 | ORAL_TABLET | ORAL | Status: DC

## 2017-11-11 MED ORDER — ACETAMINOPHEN 325 MG PO TABS: 650.0000 mg | ORAL_TABLET | ORAL | Status: DC | PRN

## 2017-11-11 MED ORDER — COCONUT OIL OIL: 1.0000 "application " | TOPICAL_OIL | Status: DC | PRN

## 2017-11-11 MED ORDER — FENTANYL 2.5 MCG/ML W/ROPIVACAINE 0.15% IN NS 100 ML EPIDURAL (ARMC)
EPIDURAL | Status: AC
  Filled 2017-11-11: qty 100

## 2017-11-11 MED ORDER — METHYLERGONOVINE MALEATE 0.2 MG PO TABS: 0.2000 mg | ORAL_TABLET | ORAL | Status: DC | PRN

## 2017-11-11 MED ORDER — DIPHENHYDRAMINE HCL 50 MG/ML IJ SOLN: 12.5000 mg | INTRAMUSCULAR | Status: DC | PRN

## 2017-11-11 MED ORDER — ONDANSETRON HCL 4 MG PO TABS: 4.0000 mg | ORAL_TABLET | ORAL | Status: DC | PRN

## 2017-11-11 MED ORDER — METHYLERGONOVINE MALEATE 0.2 MG/ML IJ SOLN: 0.2000 mg | INTRAMUSCULAR | Status: DC | PRN

## 2017-11-11 MED ORDER — SIMETHICONE 80 MG PO CHEW: 80.0000 mg | CHEWABLE_TABLET | ORAL | Status: DC | PRN

## 2017-11-11 NOTE — Progress Notes (Signed)
1400- notified pediatrician that infants mother is positive for influenza. Notified ID and received recommendation that mother avoid close contact with infant as much as possible and when she is in close contact she should use good hand hygiene and wear a mask. It is also recommended that mother pump and have someone else feed the infant. Shared all af this information with mother. Mother verbalized understanding but stated that she would still breast feed. Re- educated on the importance of good hand hygiene and wearing a mask when in close proximity of infant.

## 2017-11-11 NOTE — Progress Notes (Signed)
   11/11/17 0855  Clinical Encounter Type  Visited With Patient not available;Health care provider  Visit Type Initial  Referral From Nurse  Consult/Referral To Chaplain   Chaplain responded to order request for this patient.  After reviewing notes, chaplain checked in with staff to see if patient was available.  Per staff report, patient is pushing now.  Chaplain to follow up later, encouraged staff to page chaplain if patient had an immediate need.

## 2017-11-11 NOTE — H&P (Addendum)
History and Physical   HPI  Marcia Villarreal is a 27 y.o. G2P1001 at [redacted]w[redacted]d Estimated Date of Delivery: 11/18/17 who is being admitted for labor management. She complains of contractions since 4 am. She feels good fetal movement and has leaking of fluid with coughing. Her history is significant for irregular prenatal care and GDM. She was non compliant with her GDM management. She never went to her lifestyles diabetic teaching , nor did she monitor her blood sugar.    OB History  Obstetric History   G2   P1   T1   P0   A0   L1    SAB0   TAB0   Ectopic0   Multiple0   Live Births1     # Outcome Date GA Lbr Len/2nd Weight Sex Delivery Anes PTL Lv  2 Current           1 Term 05/19/16 [redacted]w[redacted]d  8 lb 2.4 oz (3.697 kg) M Vag-Spont  N LIV      PROBLEM LIST  Pregnancy complications or risks: Patient Active Problem List   Diagnosis Date Noted  . Labor and delivery, indication for care 11/11/2017  . Indication for care in labor or delivery 11/05/2017  . Anxiety and depression 10/19/2017  . Limited prenatal care in third trimester 10/18/2017  . BMI 40.0-44.9, adult (HCC) 05/18/2017  . Supervision of normal intrauterine pregnancy in multigravida in second trimester 05/18/2017  . History of marijuana use 05/18/2017    Prenatal labs and studies: ABO, Rh: --/--/AB POS (03/07 1610) Antibody: NEG (03/07 0657) Rubella: 2.55 (08/10 1057) RPR: Non Reactive (01/02 1121)  HBsAg: Negative (08/10 1057)  HIV: Non Reactive (08/10 1057)  GBS: negative   Past Medical History:  Diagnosis Date  . Anxiety   . Back problem    herniated and bulging disc per pt  . Depression   . Migraines      Past Surgical History:  Procedure Laterality Date  . DENTAL SURGERY       Medications    Current Discharge Medication List    CONTINUE these medications which have NOT CHANGED   Details  hydrOXYzine (ATARAX/VISTARIL) 25 MG tablet Take 1 tablet (25 mg total) by mouth every 6 (six) hours as needed for  anxiety. Qty: 30 tablet, Refills: 2    Prenat-FeFum-FePo-FA-DHA w/o A (PROVIDA DHA) 16-16-1.25-110 MG CAPS Take 1 capsule by mouth daily. Qty: 30 capsule, Refills: 6    sertraline (ZOLOFT) 50 MG tablet Take 1 tablet (50 mg total) by mouth daily. Qty: 30 tablet, Refills: 1         Allergies  Penicillins and Sulfa antibiotics  Review of Systems  Constitutional: negative Eyes: negative Ears, nose, mouth, throat, and face: negative Respiratory: positive for cough Cardiovascular: negative Gastrointestinal: negative Genitourinary:negative Integument/breast: negative Hematologic/lymphatic: negative Musculoskeletal:negative Neurological: negative Behavioral/Psych: negative Endocrine: negative Allergic/Immunologic: negative  Physical Exam  BP 128/64   Pulse 97   Temp 97.7 F (36.5 C) (Oral)   Resp 16   Ht 5' 5.5" (1.664 m)   Wt 274 lb (124.3 kg)   LMP 02/11/2017 (Exact Date) Comment: hx irregular periods  SpO2 100%   BMI 44.90 kg/m   Lungs:  Clear bilaterally Cardio: RRR  Abd: Soft, gravid, NT Presentation: cephalic EXT: No C/C/ 1+ Edema CERVIX: 9cm  :  90%:   -1:    mid position:    soft  See Prenatal records for more detailed PE.     FHR:  Baseline: 120 bpm, Variability:  Good {> 6 bpm), Accelerations: Reactive and Decelerations: Absent Prolonged deceleration after AROM for thin meconium @ 0839. Fetal heart rate decreased down to 70's with slow return to baseline. IUPC and FSE placed, maternal position change right to left lateral then to hands a knees.IV fluid bolus and oxygen via face mask.  Attempted to have patient push and manually reduce cervix without success. Dr. Logan BoresEvans consulting MD notified. Maternal blood pressure stable. Fetal heart rate returned to baseline after 9 minutes and placing patient in hand & knees position. Dr. Logan BoresEvans was present @ [phone]. Fetal heart rate is reassuring @ 0904. Toco: Uterine Contractions: unable to determine with toco, IUPC  placed.    Test Results  Results for orders placed or performed during the hospital encounter of 11/11/17 (from the past 24 hour(s))  CBC     Status: Abnormal   Collection Time: 11/11/17  6:57 AM  Result Value Ref Range   WBC 8.8 3.6 - 11.0 K/uL   RBC 4.26 3.80 - 5.20 MIL/uL   Hemoglobin 12.5 12.0 - 16.0 g/dL   HCT 96.037.5 45.435.0 - 09.847.0 %   MCV 88.0 80.0 - 100.0 fL   MCH 29.4 26.0 - 34.0 pg   MCHC 33.4 32.0 - 36.0 g/dL   RDW 11.915.3 (H) 14.711.5 - 82.914.5 %   Platelets 184 150 - 440 K/uL  Type and screen [facility]     Status: None   Collection Time: 11/11/17  6:57 AM  Result Value Ref Range   ABO/RH(D) AB POS    Antibody Screen NEG    Sample Expiration      11/14/2017 Performed at [facility], [address] [place], [place] [address]   Urine Drug Screen, Qualitative ([facility] only)     Status: None   Collection Time: 11/11/17  7:47 AM  Result Value Ref Range   Tricyclic, Ur Screen NONE DETECTED NONE DETECTED   Amphetamines, Ur Screen NONE DETECTED NONE DETECTED   MDMA (Ecstasy)Ur Screen NONE DETECTED NONE DETECTED   Cocaine Metabolite,Ur [place] NONE DETECTED NONE DETECTED   Opiate, Ur Screen NONE DETECTED NONE DETECTED   Phencyclidine (PCP) Ur S NONE DETECTED NONE DETECTED   Cannabinoid 50 Ng, Ur [place] NONE DETECTED NONE DETECTED   Barbiturates, Ur Screen NONE DETECTED NONE DETECTED   Benzodiazepine, Ur Scrn NONE DETECTED NONE DETECTED   Methadone Scn, Ur NONE DETECTED NONE DETECTED   Group B Strep negative  Assessment   G2P1001 at 3721w0d Estimated Date of Delivery: 11/18/17  The fetus is reassuring.   Patient Active Problem List   Diagnosis Date Noted  . Labor and delivery, indication for care 11/11/2017  . Indication for care in labor or delivery 11/05/2017  . Anxiety and depression 10/19/2017  . Limited prenatal care in third trimester 10/18/2017  . BMI 40.0-44.9, adult (HCC) 05/18/2017  . Supervision of normal intrauterine pregnancy in multigravida  in second trimester 05/18/2017  . History of marijuana use 05/18/2017    Plan  1. Admitedt to L&D : for labor management 2. EFM:-- Category 2 3. Epidural placed 4. Admission labs completed 5. Anticipate SVD  Doreene Burkennie Dalores Weger, CNM  11/11/2017 9:03 AM

## 2017-11-11 NOTE — Anesthesia Procedure Notes (Signed)
Epidural Patient location during procedure: OB Start time: 11/11/2017 7:52 AM End time: 11/11/2017 8:08 AM  Staffing Anesthesiologist: Alver FisherPenwarden, Fenna Semel, MD Performed: anesthesiologist   Preanesthetic Checklist Completed: patient identified, site marked, surgical consent, pre-op evaluation, timeout performed, IV checked, risks and benefits discussed and monitors and equipment checked  Epidural Patient position: sitting Prep: ChloraPrep Patient monitoring: heart rate, continuous pulse ox and blood pressure Approach: midline Location: L3-L4 Injection technique: LOR saline  Needle:  Needle type: Tuohy  Needle gauge: 18 G Needle length: 9 cm and 9 Needle insertion depth: 9 cm Catheter type: closed end flexible Catheter size: 20 Guage Catheter at skin depth: 15 cm Test dose: negative (0.125% bupivacaine)  Assessment Events: blood not aspirated, injection not painful, no injection resistance, negative IV test and no paresthesia  Additional Notes   Patient tolerated the insertion well without complications.Reason for block:procedure for pain

## 2017-11-11 NOTE — OB Triage Note (Signed)
Patient arrived in triage with c/o contractions since approx 0400. Reports good fetal movement. Denies vaginal bleeding.  Patient reports intermittent leaking of fluid with coughing. Notes she has "been sick" since Sat. Reports sick with coughing, stuffy nose, slight nausea but no vomiting or diarrhea. No confirmed fevers. EFM applied and assessing.

## 2017-11-11 NOTE — Anesthesia Preprocedure Evaluation (Signed)
Anesthesia Evaluation  Patient identified by MRN, date of birth, ID band Patient awake    Reviewed: Allergy & Precautions, NPO status , Patient's Chart, lab work & pertinent test results  History of Anesthesia Complications Negative for: history of anesthetic complications  Airway Mallampati: III  TM Distance: >3 FB Neck ROM: Full    Dental  (+) Poor Dentition   Pulmonary neg sleep apnea, neg COPD, Current Smoker,    breath sounds clear to auscultation- rhonchi (-) wheezing      Cardiovascular Exercise Tolerance: Good (-) hypertension(-) CAD, (-) Past MI, (-) Cardiac Stents and (-) CABG  Rhythm:Regular Rate:Normal - Systolic murmurs and - Diastolic murmurs    Neuro/Psych  Headaches, PSYCHIATRIC DISORDERS Anxiety Depression    GI/Hepatic negative GI ROS, Neg liver ROS,   Endo/Other  negative endocrine ROSneg diabetes  Renal/GU negative Renal ROS     Musculoskeletal negative musculoskeletal ROS (+)   Abdominal (+) + obese,   Peds  Hematology negative hematology ROS (+)   Anesthesia Other Findings Past Medical History: No date: Anxiety No date: Back problem     Comment:  herniated and bulging disc per pt No date: Depression No date: Migraines   Reproductive/Obstetrics (+) Pregnancy                             Lab Results  Component Value Date   WBC 8.8 11/11/2017   HGB 12.5 11/11/2017   HCT 37.5 11/11/2017   MCV 88.0 11/11/2017   PLT 184 11/11/2017    Anesthesia Physical Anesthesia Plan  ASA: II  Anesthesia Plan: Epidural   Post-op Pain Management:    Induction:   PONV Risk Score and Plan: 1  Airway Management Planned:   Additional Equipment:   Intra-op Plan:   Post-operative Plan:   Informed Consent: I have reviewed the patients History and Physical, chart, labs and discussed the procedure including the risks, benefits and alternatives for the proposed anesthesia  with the patient or authorized representative who has indicated his/her understanding and acceptance.     Plan Discussed with: CRNA and Anesthesiologist  Anesthesia Plan Comments: (Plan for epidural for labor, discussed epidural vs spinal vs GA if need for csection)        Anesthesia Quick Evaluation

## 2017-11-12 ENCOUNTER — Ambulatory Visit: Payer: Self-pay

## 2017-11-12 LAB — CBC
HCT: 35.5 % (ref 35.0–47.0)
HEMOGLOBIN: 11.8 g/dL — AB (ref 12.0–16.0)
MCH: 29.6 pg (ref 26.0–34.0)
MCHC: 33.4 g/dL (ref 32.0–36.0)
MCV: 88.7 fL (ref 80.0–100.0)
PLATELETS: 141 10*3/uL — AB (ref 150–440)
RBC: 4 MIL/uL (ref 3.80–5.20)
RDW: 15.1 % — ABNORMAL HIGH (ref 11.5–14.5)
WBC: 7.6 10*3/uL (ref 3.6–11.0)

## 2017-11-12 LAB — RPR: RPR: NONREACTIVE

## 2017-11-12 MED ORDER — DOCUSATE SODIUM 100 MG PO CAPS: 100.0000 mg | ORAL_CAPSULE | Freq: Two times a day (BID) | ORAL | 0 refills | Status: DC

## 2017-11-12 MED ORDER — MEDROXYPROGESTERONE ACETATE 150 MG/ML IM SUSP: 150.0000 mg | Freq: Once | INTRAMUSCULAR | 3 refills | Status: DC

## 2017-11-12 MED ORDER — MEDROXYPROGESTERONE ACETATE 150 MG/ML IM SUSP
150.0000 mg | Freq: Once | INTRAMUSCULAR | Status: AC
  Administered 2017-11-12: 150 mg via INTRAMUSCULAR
  Filled 2017-11-12: qty 1

## 2017-11-12 NOTE — Lactation Note (Signed)
This note was copied from a baby's chart. Lactation Consultation Note  Patient Name: Marcia Villarreal Today's Date: 11/12/2017     Maternal Data  [facility] concerned about knowing how much baby taking at breast, she states she hears swallows and baby is voiding and stooling, I advised her to listen for swallows and record output, see [facility] for appts and to have baby's wt followed, she states she breastfed her first, child and he was hungry all of the time and breastfed a lot, states this baby was sleepy and not hungry, I encouraged her to attempt breastfeeding every 2-3 hrs and wake to feed if baby does not awaken on his own., [facility] if concerns about feeding   Feeding    LATCH Score                   Interventions    Lactation Tools Discussed/Used     Consult Status      Marcia Villarreal 11/12/2017, 4:45 PM

## 2017-11-12 NOTE — Progress Notes (Signed)
Patient asked multiple times throughout shift (beginning at 1900) to attempt to breastfeed baby. Patient stated that she had attempted on three separate occasions to feed baby with no success, due to the infant being sleepy. During one of these attempts, Cyndi LennertEmily Zed Wanninger, RN asked if she could assist her with breastfeeding to try and help keep the baby awake to eat. She refused the assistance and stated she would attempt again soon. At approximately 0215, RN entered room and stated that an attempt needed to be witnessed and if feeding was unsuccessful, the infant's blood sugar would need to be obtained. She agreed to attempt with assistance. Upon assessing infant, he began successfully sucking RN's finger as a hunger cue. RN asked if she could help assist position the infant on mothers abdomen to help achieve latch for feeding. The patient then began flailing her free hand towards RN yelling, "Please get away from me right now". RN stepped back and observed the infant successfully latch on mother's breast within 45 seconds and began feeding. RN left room. Will continue to monitor.  Ron ParkerHerron, Teagan Heidrick D, RN

## 2017-11-12 NOTE — Progress Notes (Signed)
   11/12/17 1125  Clinical Encounter Type  Visited With Other (Comment) (see note)  Visit Type Follow-up   While chaplain was en route to follow up, had to respond to a code blue on another unit.  To attempt again today.

## 2017-11-12 NOTE — Discharge Summary (Signed)
Discharge Summary  Date of Admission: 11/11/2017  Date of Discharge: 11/12/2017  Admitting Diagnosis: Onset of Labor at 4128w0d  Mode of Delivery: normal spontaneous vaginal delivery                 Discharge Diagnosis: No other diagnosis   Intrapartum Procedures: Atificial rupture of membranes, epidural, pitocin augmentation, placement of fetal scalp electrode and placement of intrauterine catheter   Post partum procedures: Depo injection  Complications: none                      Discharge Day SOAP Note:  Progress Note - Vaginal Delivery  Marcia Villarreal is a 27 y.o. G2P2002 now PP day 1 s/p Vaginal, Spontaneous . Delivery was uncomplicated  Subjective  The patient has the following complaints: has no unusual complaints  Pain is controlled with current medications.   Patient is urinating without difficulty.  She is ambulating well.    Objective  Vital signs: BP (!) 105/52 (BP Location: Left Arm)   Pulse 68   Temp (!) 97.5 F (36.4 C) (Oral) Comment: Drank water  Resp 20   Ht 5' 5.5" (1.664 m)   Wt 274 lb (124.3 kg)   LMP 02/11/2017 (Exact Date) Comment: hx irregular periods  SpO2 99%   Breastfeeding? Unknown   BMI 44.90 kg/m   Physical Exam: Gen: NAD Fundus Fundal Tone: Firm  Lochia Amount: Small  Perineum Appearance: Intact     Data Review Labs: CBC Latest Ref Rng & Units 11/12/2017 11/11/2017 09/08/2017  WBC 3.6 - 11.0 K/uL 7.6 8.8 9.7  Hemoglobin 12.0 - 16.0 g/dL 11.8(L) 12.5 12.1  Hematocrit 35.0 - 47.0 % 35.5 37.5 36.6  Platelets 150 - 440 K/uL 141(L) 184 186   AB POS  Assessment/Plan  Active Problems:   Labor and delivery, indication for care    Plan for discharge today.   Discharge Instructions: Per After Visit Summary. Activity: Advance as tolerated. Pelvic rest for 6 weeks.  Also refer to After Visit Summary Diet: Regular Medications: Allergies as of 11/12/2017      Reactions   Penicillins    Sulfa Antibiotics       Medication List    TAKE these medications   docusate sodium 100 MG capsule Commonly known as:  COLACE Take 1 capsule (100 mg total) by mouth 2 (two) times daily.   hydrOXYzine 25 MG tablet Commonly known as:  ATARAX/VISTARIL Take 1 tablet (25 mg total) by mouth every 6 (six) hours as needed for anxiety.   medroxyPROGESTERone 150 MG/ML injection Commonly known as:  DEPO-PROVERA Inject 1 mL (150 mg total) into the muscle once for 1 dose.   PROVIDA DHA 16-16-1.25-110 MG Caps Take 1 capsule by mouth daily.   sertraline 50 MG tablet Commonly known as:  ZOLOFT Take 1 tablet (50 mg total) by mouth daily.      Outpatient follow up: 6 wks for PPV and 2 hr GTT Postpartum contraception: Depo provera injection Discharged Condition: good  Discharged to: home  Newborn Data: Disposition:home with mother  Apgars: APGAR (1 MIN): 8   APGAR (5 MINS): 9   APGAR (10 MINS):    Baby Feeding: Breast    Marcia Villarreal, CNM  11/12/2017 9:37 AM

## 2017-11-12 NOTE — Anesthesia Postprocedure Evaluation (Signed)
Anesthesia Post Note  Patient: Marcia Villarreal  Procedure(s) Performed: AN AD HOC LABOR EPIDURAL  Patient location during evaluation: Mother Baby Anesthesia Type: Epidural Level of consciousness: awake, awake and alert, oriented and patient cooperative Pain management: pain level controlled Vital Signs Assessment: post-procedure vital signs reviewed and stable Respiratory status: spontaneous breathing, nonlabored ventilation and respiratory function stable Cardiovascular status: stable Postop Assessment: no headache, no backache and epidural receding     Last Vitals:  Vitals:   11/12/17 0518 11/12/17 0617  BP: (!) 96/43 105/61  Pulse: (!) 57 66  Resp: 20   Temp: (!) 36.3 C   SpO2: 98%     Last Pain:  Vitals:   11/12/17 0518  TempSrc: Oral  PainSc:                  Lyn RecordsNoles,  Mariem Skolnick R

## 2017-11-12 NOTE — Progress Notes (Signed)
   11/12/17 1500  Clinical Encounter Type  Visited With Patient  Visit Type Follow-up   Chaplain followed up with patient.  Patient declined visit, reported that her pastor visited her yesterday.  Does not want any further follow up.

## 2017-11-12 NOTE — Final Progress Note (Signed)
Discharge Day SOAP Note:  Progress Note - Vaginal Delivery  Marcia Villarreal is a 27 y.o. [id number] now PP day 1 s/p Vaginal, Spontaneous . Delivery was uncomplicated  Subjective  The patient has the following complaints: has no unusual complaints  Pain is controlled with current medications.   Patient is urinating without difficulty.  She is ambulating well.    Objective  Vital signs: BP (!) 105/52 (BP Location: Left Arm)   Pulse 68   Temp (!) 97.5 F (36.4 C) (Oral) Comment: Drank water  Resp 20   Ht 5' 5.5" (1.664 m)   Wt 274 lb (124.3 kg)   LMP 02/11/2017 (Exact Date) Comment: hx irregular periods  SpO2 99%   Breastfeeding? Unknown   BMI 44.90 kg/m   Physical Exam: Gen: NAD Fundus Fundal Tone: Firm  Lochia Amount: Small  Perineum Appearance: Intact     Data Review Labs: CBC Latest Ref Rng & Units 11/12/2017 11/11/2017 09/08/2017  WBC 3.6 - 11.0 K/uL 7.6 8.8 9.7  Hemoglobin 12.0 - 16.0 g/dL 11.8(L) 12.5 12.1  Hematocrit 35.0 - 47.0 % 35.5 37.5 36.6  Platelets 150 - 440 K/uL 141(L) 184 186   AB POS  Assessment/Plan  Active Problems:   Labor and delivery, indication for care    Plan for discharge today.   Discharge Instructions: Per After Visit Summary. Activity: Advance as tolerated. Pelvic rest for 6 weeks.  Also refer to After Visit Summary Diet: Regular Medications: Allergies as of 11/12/2017      Reactions   Penicillins    Sulfa Antibiotics       Medication List    TAKE these medications   docusate sodium 100 MG capsule Commonly known as:  COLACE Take 1 capsule (100 mg total) by mouth 2 (two) times daily.   hydrOXYzine 25 MG tablet Commonly known as:  ATARAX/VISTARIL Take 1 tablet (25 mg total) by mouth every 6 (six) hours as needed for anxiety.   medroxyPROGESTERone 150 MG/ML injection Commonly known as:  DEPO-PROVERA Inject 1 mL (150 mg total) into the muscle once for 1 dose.   PROVIDA DHA 16-16-1.25-110 MG Caps Take 1 capsule by mouth  daily.   sertraline 50 MG tablet Commonly known as:  ZOLOFT Take 1 tablet (50 mg total) by mouth daily.      Outpatient follow up: 6 wks for PPV and 2 hr GTT Postpartum contraception: Depo provera injection Discharged Condition: good  Discharged to: home  Newborn Data: Disposition:home with mother  Apgars: APGAR (1 MIN): 8   APGAR (5 MINS): 9   APGAR (10 MINS):    Baby Feeding: Breast    Doreene Burkennie Porcia Morganti, CNM  11/12/2017 9:37 AM

## 2017-11-12 NOTE — Clinical Social Work Maternal (Signed)
CLINICAL SOCIAL WORK MATERNAL/CHILD NOTE  Patient Details  Name: Marcia Villarreal MRN: [id number] Date of Birth: 01-18-1991  Date:  11/12/2017  Clinical Social Worker Initiating Note:  York Spaniel MSW,LCSW Date/Time: Initiated:  11/12/17/      Child's Name:      Biological Parents:  Mother   Need for Interpreter:  None   Reason for Referral:  Current Substance Use/Substance Use During Pregnancy    Address:  [address] [place] [address]    Phone number:  [phone] (home)     Additional phone number: none  Household Members/Support Persons (HM/SP):       HM/SP Name Relationship DOB or Age  HM/SP -1        HM/SP -2        HM/SP -3        HM/SP -4        HM/SP -5        HM/SP -6        HM/SP -7        HM/SP -8          Natural Supports (not living in the home):      Professional Supports:     Employment:     Type of Work:     Education:      Homebound arranged:    Surveyor, quantity Resources:  Medicaid   Other Resources:      Cultural/Religious Considerations Which May Impact Care:  none  Strengths:  Ability to meet basic needs , Home prepared for child    Psychotropic Medications:         Pediatrician:       Pediatrician List:   [facility] [place]    [place]      Pediatrician Fax Number:    Risk Factors/Current Problems:  Substance Use    Cognitive State:  Able to Concentrate , Alert    Mood/Affect:  Agitated , Constricted , Irritable    CSW Assessment: CSW asked to see patient due to history of marijuana use. Both patient and her newborn's urine drug screen were negative for any illicit substances. Patient's newborn's cord tissue results are pending.   CSW introduced self to patient and explained role and purpose of visit. Patient had just had nurse step out of room and her aid was finishing up and exiting room when CSW entered. Patient immediately  rolled her eyes upon CSW introducing self. She initially was answering in short, abrupt, angry answers. She answered she lives with her cousin. When asked if she has all necessities for her newborn, she answered "I always worry about necessities, my baby's daddy is a piece of shit." "I'm tired of everyone coming in this room and bothering me." CSW informed patient CSW would make it brief and then asked her about her marijuana use and informed her that currently with both of them negative in their urine drug screens, that we will await the cord tissue result and if they returned positive, a DSS CPS report would be made. At that time, patient's demeanor changed and she became more pleasant. She wanted to tell CSW that she had used the marijuana reportedly prior to knowing she was pregnant and that she did not use with her other child who is in the home with her. She stated that she has received education regarding postpartum depression and that she does have a  history of depression and anxiety and sees a physician for this and takes prescribed medication for her anxiety and depression. Patient had no questions or concerns when asked about any other issues.   CSW Plan/Description:  CSW Will Continue to Monitor Umbilical Cord Tissue Drug Screen Results and [facility]    Maclovia Uher, LCSW 11/12/2017, 10:18 AM

## 2017-11-12 NOTE — Progress Notes (Signed)
Pt discharged with infant.  Discharge instructions, prescriptions and follow up appointment given to and reviewed with pt. Pt verbalized understanding. Escorted out by auxillary. 

## 2017-11-18 ENCOUNTER — Telehealth: Payer: Self-pay | Admitting: *Deleted

## 2017-11-18 NOTE — Telephone Encounter (Signed)
Cassandra from [facility] called stating she went and performed a home visit on pt, her edinburgh score was 12, no thoughts of harming self or baby, called pt left vm to call our office and set up appt

## 2018-01-07 ENCOUNTER — Other Ambulatory Visit: Payer: Self-pay

## 2018-01-07 ENCOUNTER — Emergency Department
Admission: EM | Admit: 2018-01-07 | Discharge: 2018-01-07 | Disposition: A | Discharge status: Home / Self Care | Payer: Medicaid Other | Attending: Emergency Medicine | Admitting: Emergency Medicine

## 2018-01-07 ENCOUNTER — Encounter: Payer: Self-pay | Admitting: Emergency Medicine

## 2018-01-07 DIAGNOSIS — H5789 Other specified disorders of eye and adnexa: Secondary | ICD-10-CM | POA: Insufficient documentation

## 2018-01-07 DIAGNOSIS — Z5321 Procedure and treatment not carried out due to patient leaving prior to being seen by health care provider: Secondary | ICD-10-CM | POA: Insufficient documentation

## 2018-01-07 NOTE — ED Triage Notes (Signed)
Pt to ED via POV c/o possible pink eye in both eye. Pt in NAD at this time

## 2019-08-18 ENCOUNTER — Emergency Department
Admission: EM | Admit: 2019-08-18 | Discharge: 2019-08-18 | Disposition: A | Discharge status: Home / Self Care | Payer: Medicaid Other | Attending: Emergency Medicine | Admitting: Emergency Medicine

## 2019-08-18 ENCOUNTER — Other Ambulatory Visit: Payer: Self-pay

## 2019-08-18 ENCOUNTER — Emergency Department: Payer: Medicaid Other

## 2019-08-18 DIAGNOSIS — Z5321 Procedure and treatment not carried out due to patient leaving prior to being seen by health care provider: Secondary | ICD-10-CM | POA: Insufficient documentation

## 2019-08-18 DIAGNOSIS — M549 Dorsalgia, unspecified: Secondary | ICD-10-CM | POA: Insufficient documentation

## 2019-08-18 NOTE — ED Triage Notes (Signed)
Pt arrives via ems from her car, pt states that she made a turn with her body inside her car and had immediate back pain. Pt states that she has been having intermittent issues with her back since she was 28years old. Pt is having bilat upper leg weakness and had difficulty standing due to pain and weakness

## 2020-10-11 ENCOUNTER — Encounter: Payer: Self-pay | Admitting: Certified Nurse Midwife

## 2020-10-11 ENCOUNTER — Other Ambulatory Visit: Payer: Self-pay

## 2020-10-11 ENCOUNTER — Ambulatory Visit (INDEPENDENT_AMBULATORY_CARE_PROVIDER_SITE_OTHER): Payer: Medicaid Other | Admitting: Certified Nurse Midwife

## 2020-10-11 VITALS — BP 95/55 | HR 80 | Ht 65.0 in | Wt 234.0 lb

## 2020-10-11 DIAGNOSIS — O26859 Spotting complicating pregnancy, unspecified trimester: Secondary | ICD-10-CM | POA: Diagnosis not present

## 2020-10-11 DIAGNOSIS — N926 Irregular menstruation, unspecified: Secondary | ICD-10-CM | POA: Diagnosis not present

## 2020-10-11 DIAGNOSIS — F419 Anxiety disorder, unspecified: Secondary | ICD-10-CM

## 2020-10-11 DIAGNOSIS — Z3201 Encounter for pregnancy test, result positive: Secondary | ICD-10-CM

## 2020-10-11 DIAGNOSIS — O99210 Obesity complicating pregnancy, unspecified trimester: Secondary | ICD-10-CM

## 2020-10-11 DIAGNOSIS — Z8632 Personal history of gestational diabetes: Secondary | ICD-10-CM | POA: Diagnosis not present

## 2020-10-11 DIAGNOSIS — F32A Depression, unspecified: Secondary | ICD-10-CM

## 2020-10-11 LAB — POCT URINE PREGNANCY: Preg Test, Ur: POSITIVE — AB

## 2020-10-11 NOTE — Patient Instructions (Signed)
Common Medications Safe in Pregnancy  Acne:      Constipation:  Benzoyl Peroxide     Colace  Clindamycin      Dulcolax Suppository  Topica Erythromycin     Fibercon  Salicylic Acid      Metamucil         Miralax AVOID:        Senakot   Accutane    Cough:  Retin-A       Cough Drops  Tetracycline      Phenergan w/ Codeine if Rx  Minocycline      Robitussin (Plain & DM)  Antibiotics:     Crabs/Lice:  Ceclor       RID  Cephalosporins    AVOID:  E-Mycins      Kwell  Keflex  Macrobid/Macrodantin   Diarrhea:  Penicillin      Kao-Pectate  Zithromax      Imodium AD         PUSH FLUIDS AVOID:       Cipro     Fever:  Tetracycline      Tylenol (Regular or Extra  Minocycline       Strength)  Levaquin      Extra Strength-Do not          Exceed 8 tabs/24 hrs Caffeine:        <200mg/day (equiv. To 1 cup of coffee or  approx. 3 12 oz sodas)         Gas: Cold/Hayfever:       Gas-X  Benadryl      Mylicon  Claritin       Phazyme  **Claritin-D        Chlor-Trimeton    Headaches:  Dimetapp      ASA-Free Excedrin  Drixoral-Non-Drowsy     Cold Compress  Mucinex (Guaifenasin)     Tylenol (Regular or Extra  Sudafed/Sudafed-12 Hour     Strength)  **Sudafed PE Pseudoephedrine   Tylenol Cold & Sinus     Vicks Vapor Rub  Zyrtec  **AVOID if Problems With Blood Pressure         Heartburn: Avoid lying down for at least 1 hour after meals  Aciphex      Maalox     Rash:  Milk of Magnesia     Benadryl    Mylanta       1% Hydrocortisone Cream  Pepcid  Pepcid Complete   Sleep Aids:  Prevacid      Ambien   Prilosec       Benadryl  Rolaids       Chamomile Tea  Tums (Limit 4/day)     Unisom         Tylenol PM         Warm milk-add vanilla or  Hemorrhoids:       Sugar for taste  Anusol/Anusol H.C.  (RX: Analapram 2.5%)  Sugar Substitutes:  Hydrocortisone OTC     Ok in moderation  Preparation H      Tucks        Vaseline lotion applied to tissue with  wiping    Herpes:     Throat:  Acyclovir      Oragel  Famvir  Valtrex     Vaccines:         Flu Shot Leg Cramps:       *Gardasil  Benadryl      Hepatitis A         Hepatitis B Nasal Spray:         Pneumovax  Saline Nasal Spray     Polio Booster         Tetanus Nausea:       Tuberculosis test or PPD  Vitamin B6 25 mg TID   AVOID:    Dramamine      *Gardasil  Emetrol       Live Poliovirus  Ginger Root 250 mg QID    MMR (measles, mumps &  High Complex Carbs @ Bedtime    rebella)  Sea Bands-Accupressure    Varicella (Chickenpox)  Unisom 1/2 tab TID     *No known complications           If received before Pain:         Known pregnancy;   Darvocet       Resume series after  Lortab        Delivery  Percocet    Yeast:   Tramadol      Femstat  Tylenol 3      Gyne-lotrimin  Ultram       Monistat  Vicodin           MISC:         All Sunscreens           Hair Coloring/highlights          Insect Repellant's          (Including DEET)         Mystic Tans    https://www.cdc.gov/pregnancy/infections.html">  First Trimester of Pregnancy  The first trimester of pregnancy starts on the first day of your last menstrual period until the end of week 12. This is also called months 1 through 3 of pregnancy. Body changes during your first trimester Your body goes through many changes during pregnancy. The changes usually return to normal after your baby is born. Physical changes  You may gain or lose weight.  Your breasts may grow larger and hurt. The area around your nipples may get darker.  Dark spots or blotches may develop on your face.  You may have changes in your hair. Health changes  You may feel like you might vomit (nauseous), and you may vomit.  You may have heartburn.  You may have headaches.  You may have trouble pooping (constipation).  Your gums may bleed. Other changes  You may get tired easily.  You may pee (urinate) more often.  Your menstrual periods will  stop.  You may not feel hungry.  You may want to eat certain kinds of food.  You may have changes in your emotions from day to day.  You may have more dreams. Follow these instructions at home: Medicines  Take over-the-counter and prescription medicines only as told by your doctor. Some medicines are not safe during pregnancy.  Take a prenatal vitamin that contains at least 600 micrograms (mcg) of folic acid. Eating and drinking  Eat healthy meals that include: ? Fresh fruits and vegetables. ? Whole grains. ? Good sources of protein, such as meat, eggs, or tofu. ? Low-fat dairy products.  Avoid raw meat and unpasteurized juice, milk, and cheese.  If you feel like you may vomit, or you vomit: ? Eat 4 or 5 small meals a day instead of 3 large meals. ? Try eating a few soda crackers. ? Drink liquids between meals instead of during meals.  You may need to take these actions to prevent or treat trouble pooping: ? Drink enough fluids to keep your pee (urine) pale yellow. ?   Eat foods that are high in fiber. These include beans, whole grains, and fresh fruits and vegetables. ? Limit foods that are high in fat and sugar. These include fried or sweet foods. Activity  Exercise only as told by your doctor. Most people can do their usual exercise routine during pregnancy.  Stop exercising if you have cramps or pain in your lower belly (abdomen) or low back.  Do not exercise if it is too hot or too humid, or if you are in a place of great height (high altitude).  Avoid heavy lifting.  If you choose to, you may have sex unless your doctor tells you not to. Relieving pain and discomfort  Wear a good support bra if your breasts are sore.  Rest with your legs raised (elevated) if you have leg cramps or low back pain.  If you have bulging veins (varicose veins) in your legs: ? Wear support hose as told by your doctor. ? Raise your feet for 15 minutes, 3-4 times a day. ? Limit salt  in your food. Safety  Wear your seat belt at all times when you are in a car.  Talk with your doctor if someone is hurting you or yelling at you.  Talk with your doctor if you are feeling sad or have thoughts of hurting yourself. Lifestyle  Do not use hot tubs, steam rooms, or saunas.  Do not douche. Do not use tampons or scented sanitary pads.  Do not use herbal medicines, illegal drugs, or medicines that are not approved by your doctor. Do not drink alcohol.  Do not smoke or use any products that contain nicotine or tobacco. If you need help quitting, ask your doctor.  Avoid cat litter boxes and soil that is used by cats. These carry germs that can cause harm to the baby and can cause a loss of your baby by miscarriage or stillbirth. General instructions  Keep all follow-up visits. This is important.  Ask for help if you need counseling or if you need help with nutrition. Your doctor can give you advice or tell you where to go for help.  Visit your dentist. At home, brush your teeth with a soft toothbrush. Floss gently.  Write down your questions. Take them to your prenatal visits. Where to find more information  American Pregnancy Association: americanpregnancy.org  American College of Obstetricians and Gynecologists: www.acog.org  Office on Women's Health: womenshealth.gov/pregnancy Contact a doctor if:  You are dizzy.  You have a fever.  You have mild cramps or pressure in your lower belly.  You have a nagging pain in your belly area.  You continue to feel like you may vomit, you vomit, or you have watery poop (diarrhea) for 24 hours or longer.  You have a bad-smelling fluid coming from your vagina.  You have pain when you pee.  You are exposed to a disease that spreads from person to person, such as chickenpox, measles, Zika virus, HIV, or hepatitis. Get help right away if:  You have spotting or bleeding from your vagina.  You have very bad belly cramping  or pain.  You have shortness of breath or chest pain.  You have any kind of injury, such as from a fall or a car crash.  You have new or increased pain, swelling, or redness in an arm or leg. Summary  The first trimester of pregnancy starts on the first day of your last menstrual period until the end of week 12 (months 1 through   3).  Eat 4 or 5 small meals a day instead of 3 large meals.  Do not smoke or use any products that contain nicotine or tobacco. If you need help quitting, ask your doctor.  Keep all follow-up visits. This information is not intended to replace advice given to you by your health care provider. Make sure you discuss any questions you have with your health care provider. Document Revised: 01/31/2020 Document Reviewed: 12/07/2019 Elsevier Patient Education  2021 [employer]  

## 2020-10-11 NOTE — Progress Notes (Signed)
GYN ENCOUNTER NOTE  Subjective:       Marcia Villarreal is a 30 y.o. G49P2002 female here for pregnancy confirmation.   Endorses food aversion, nausea without vomiting, and intermittent abdominal cramping. Notes single episode of spotting with wiping last week.   Denies difficulty breathing or respiratory distress, chest pain, abdominal pain, excessive vaginal bleeding, dysuria, and leg pain.   History significant for depression, previously on Zoloft.    Gynecologic History  Patient's last menstrual period was 08/14/2020.   Estimated date of birth: 05/21/2019  Gestational age: 64 weeks 2 days  Contraception: none  Last Pap: 05/2017. Results were: normal  Obstetric History  OB History  Gravida Para Term Preterm AB Living  2 2 2     2   SAB IAB Ectopic Multiple Live Births        0 2    # Outcome Date GA Lbr Len/2nd Weight Sex Delivery Anes PTL Lv  2 Term 11/11/17 [redacted]w[redacted]d 08:00 / 00:27 6 lb 9.1 oz (2.98 kg) M Vag-Spont EPI  LIV  1 Term 05/19/16 [redacted]w[redacted]d  8 lb 2.4 oz (3.697 kg) M Vag-Spont  N LIV    Past Medical History:  Diagnosis Date  . Anxiety   . Back problem    herniated and bulging disc per pt  . Depression   . Migraines     Past Surgical History:  Procedure Laterality Date  . DENTAL SURGERY      Current Outpatient Medications on File Prior to Visit  Medication Sig Dispense Refill  . medroxyPROGESTERone (DEPO-PROVERA) 150 MG/ML injection Inject 1 mL (150 mg total) into the muscle once for 1 dose. 1 mL 3  . Prenat-FeFum-FePo-FA-DHA w/o A (PROVIDA DHA) 16-16-1.25-110 MG CAPS Take 1 capsule by mouth daily. (Patient not taking: Reported on 10/11/2020) 30 capsule 6   No current facility-administered medications on file prior to visit.    Allergies  Allergen Reactions  . Penicillins   . Sulfa Antibiotics     Social History   Socioeconomic History  . Marital status: Single    Spouse name: Not on file  . Number of children: Not on file  . Years of education: Not  on file  . Highest education level: Not on file  Occupational History  . Not on file  Tobacco Use  . Smoking status: Current Every Day Smoker    Packs/day: 1.00    Types: Cigarettes  . Smokeless tobacco: Never Used  . Tobacco comment: has slowed down a lot per pt but isn't ready to "quick cold 12/09/2020"  Vaping Use  . Vaping Use: Never used  Substance and Sexual Activity  . Alcohol use: No  . Drug use: Yes    Types: Marijuana    Comment: before she found out she was pregnancy  . Sexual activity: Not Currently    Partners: Male    Birth control/protection: None    Comment: not sexually active x1 month  Other Topics Concern  . Not on file  Social History Narrative  . Not on file   Social Determinants of Health   Financial Resource Strain: Not on file  Food Insecurity: Not on file  Transportation Needs: Not on file  Physical Activity: Not on file  Stress: Not on file  Social Connections: Not on file  Intimate Partner Violence: Not on file    Family History  Problem Relation Age of Onset  . Cancer Mother   . Osteoarthritis Maternal Grandmother   . Diabetes Maternal Grandmother   .  Cancer Maternal Grandmother 60       breast cancer  . Heart failure Maternal Grandfather   . Hyperlipidemia Maternal Grandfather   . Hypertension Maternal Grandfather   . Cancer Maternal Grandfather        prostate    The following portions of the patient's history were reviewed and updated as appropriate: allergies, current medications, past family history, past medical history, past social history, past surgical history and problem list.  Review of Systems  ROS negative except as noted above. Information obtained from patient.   Objective:   BP (!) 95/55   Pulse 80   Ht 5\' 5"  (1.651 m)   Wt 234 lb (106.1 kg)   BMI 38.94 kg/m    CONSTITUTIONAL: Well-developed, well-nourished female in no acute distress.   PHYSICAL EXAM: Not indicated.   Recent Results (from the past 2160  hour(s))  POCT urine pregnancy     Status: Abnormal   Collection Time: 10/11/20 10:04 AM  Result Value Ref Range   Preg Test, Ur Positive (A) Negative   Depression screen [facility] 2/9 10/11/2020 10/18/2017  Decreased Interest 1 3  Down, Depressed, Hopeless 1 3  PHQ - 2 Score 2 6  Altered sleeping 3 1  Tired, decreased energy 3 3  Change in appetite 3 3  Feeling bad or failure about yourself  2 3  Trouble concentrating 1 2  Moving slowly or fidgety/restless 1 0  Suicidal thoughts 0 0  PHQ-9 Score 15 18  Difficult doing work/chores Very difficult Very difficult   GAD 7 : Generalized Anxiety Score 10/11/2020  Nervous, Anxious, on Edge 3  Control/stop worrying 3  Worry too much - different things 3  Trouble relaxing 3  Restless 3  Easily annoyed or irritable 3  Afraid - awful might happen 1  Total GAD 7 Score 19  Anxiety Difficulty Very difficult    Assessment:   1. Missed menses  - POCT urine pregnancy - 12/09/2020 OB LESS THAN 14 WEEKS WITH OB TRANSVAGINAL; Future  2. Positive pregnancy test  - US OB LESS THAN 14 WEEKS WITH OB TRANSVAGINAL; Future  3. Spotting in early pregnancy  - US OB LESS THAN 14 WEEKS WITH OB TRANSVAGINAL; Future     Plan:   First trimester education, see MAR.   Samples of prenatal vitamins provided.   Referral to [facility], see orders.   Reviewed red flag symptoms and when to call.   RTC x 1-2 weeks for dating/viability ultrasound & intake.  RTC x 4-5 weeks for NOB PE or sooner if needed.    [facility], CNM [facility], [facility] 10/11/20 5:37 PM

## 2020-10-12 DIAGNOSIS — Z8632 Personal history of gestational diabetes: Secondary | ICD-10-CM | POA: Insufficient documentation

## 2020-10-12 DIAGNOSIS — O99210 Obesity complicating pregnancy, unspecified trimester: Secondary | ICD-10-CM | POA: Insufficient documentation

## 2020-10-28 ENCOUNTER — Telehealth: Payer: Self-pay

## 2020-10-28 NOTE — Telephone Encounter (Signed)
Early OB 10 weeks  Spotting this a.m. Only when she wiped. Mild cramps no meds needed.   No uti sx.  No UC recently.  Advised pt to monitor. If bleeding like a period or cramps not relieve with tylenol to contact the office. Pt voiced understanding.

## 2020-10-31 ENCOUNTER — Telehealth: Payer: Self-pay | Admitting: Certified Nurse Midwife

## 2020-10-31 ENCOUNTER — Other Ambulatory Visit: Payer: Medicaid Other

## 2020-10-31 NOTE — Telephone Encounter (Signed)
LM for pt letting her know we will not have [place] [employer] tom for her apt- we have canclled apt but she does still have apt with michelle at 11:30, and someone will be in touch when [place] is rescheduled.

## 2020-11-01 ENCOUNTER — Other Ambulatory Visit: Payer: Medicaid Other

## 2020-11-04 ENCOUNTER — Telehealth: Payer: Self-pay | Admitting: Certified Nurse Midwife

## 2020-11-04 NOTE — Telephone Encounter (Signed)
Called to rescheduled apt from 3-7 due to no having nurse available for intake- pt was not happy with schedule change- states she will call back to reschedule- I placed her on schedule 3-10 until she calls back.

## 2020-11-05 ENCOUNTER — Ambulatory Visit: Payer: Medicaid Other

## 2020-11-14 ENCOUNTER — Other Ambulatory Visit: Payer: Medicaid Other

## 2020-11-15 ENCOUNTER — Other Ambulatory Visit: Payer: Self-pay

## 2020-11-15 ENCOUNTER — Ambulatory Visit (INDEPENDENT_AMBULATORY_CARE_PROVIDER_SITE_OTHER): Payer: Medicaid Other | Admitting: Surgical

## 2020-11-15 VITALS — BP 121/72 | HR 102 | Ht 65.0 in | Wt 236.2 lb

## 2020-11-15 DIAGNOSIS — Z3491 Encounter for supervision of normal pregnancy, unspecified, first trimester: Secondary | ICD-10-CM | POA: Diagnosis not present

## 2020-11-15 NOTE — Patient Instructions (Signed)
Common Medications Safe in Pregnancy  Acne:      Constipation:  Benzoyl Peroxide     Colace  Clindamycin      Dulcolax Suppository  Topica Erythromycin     Fibercon  Salicylic Acid      Metamucil         Miralax AVOID:        Senakot   Accutane    Cough:  Retin-A       Cough Drops  Tetracycline      Phenergan w/ Codeine if Rx  Minocycline      Robitussin (Plain & DM)  Antibiotics:     Crabs/Lice:  Ceclor       RID  Cephalosporins    AVOID:  E-Mycins      Kwell  Keflex  Macrobid/Macrodantin   Diarrhea:  Penicillin      Kao-Pectate  Zithromax      Imodium AD         PUSH FLUIDS AVOID:       Cipro     Fever:  Tetracycline      Tylenol (Regular or Extra  Minocycline       Strength)  Levaquin      Extra Strength-Do not          Exceed 8 tabs/24 hrs Caffeine:        <200mg/day (equiv. To 1 cup of coffee or  approx. 3 12 oz sodas)         Gas: Cold/Hayfever:       Gas-X  Benadryl      Mylicon  Claritin       Phazyme  **Claritin-D        Chlor-Trimeton    Headaches:  Dimetapp      ASA-Free Excedrin  Drixoral-Non-Drowsy     Cold Compress  Mucinex (Guaifenasin)     Tylenol (Regular or Extra  Sudafed/Sudafed-12 Hour     Strength)  **Sudafed PE Pseudoephedrine   Tylenol Cold & Sinus     Vicks Vapor Rub  Zyrtec  **AVOID if Problems With Blood Pressure         Heartburn: Avoid lying down for at least 1 hour after meals  Aciphex      Maalox     Rash:  Milk of Magnesia     Benadryl    Mylanta       1% Hydrocortisone Cream  Pepcid  Pepcid Complete   Sleep Aids:  Prevacid      Ambien   Prilosec       Benadryl  Rolaids       Chamomile Tea  Tums (Limit 4/day)     Unisom         Tylenol PM         Warm milk-add vanilla or  Hemorrhoids:       Sugar for taste  Anusol/Anusol H.C.  (RX: Analapram 2.5%)  Sugar Substitutes:  Hydrocortisone OTC     Ok in moderation  Preparation H      Tucks        Vaseline lotion applied to tissue with  wiping    Herpes:     Throat:  Acyclovir      Oragel  Famvir  Valtrex     Vaccines:         Flu Shot Leg Cramps:       *Gardasil  Benadryl      Hepatitis A         Hepatitis B Nasal Spray:         Pneumovax  Saline Nasal Spray     Polio Booster         Tetanus Nausea:       Tuberculosis test or PPD  Vitamin B6 25 mg TID   AVOID:    Dramamine      *Gardasil  Emetrol       Live Poliovirus  Ginger Root 250 mg QID    MMR (measles, mumps &  High Complex Carbs @ Bedtime    rebella)  Sea Bands-Accupressure    Varicella (Chickenpox)  Unisom 1/2 tab TID     *No known complications           If received before Pain:         Known pregnancy;   Darvocet       Resume series after  Lortab        Delivery  Percocet    Yeast:   Tramadol      Femstat  Tylenol 3      Gyne-lotrimin  Ultram       Monistat  Vicodin           MISC:         All Sunscreens           Hair Coloring/highlights          Insect Repellant's          (Including DEET)         Mystic Tans    https://www.cdc.gov/pregnancy/infections.html">  First Trimester of Pregnancy  The first trimester of pregnancy starts on the first day of your last menstrual period until the end of week 12. This is also called months 1 through 3 of pregnancy. Body changes during your first trimester Your body goes through many changes during pregnancy. The changes usually return to normal after your baby is born. Physical changes  You may gain or lose weight.  Your breasts may grow larger and hurt. The area around your nipples may get darker.  Dark spots or blotches may develop on your face.  You may have changes in your hair. Health changes  You may feel like you might vomit (nauseous), and you may vomit.  You may have heartburn.  You may have headaches.  You may have trouble pooping (constipation).  Your gums may bleed. Other changes  You may get tired easily.  You may pee (urinate) more often.  Your menstrual periods will  stop.  You may not feel hungry.  You may want to eat certain kinds of food.  You may have changes in your emotions from day to day.  You may have more dreams. Follow these instructions at home: Medicines  Take over-the-counter and prescription medicines only as told by your doctor. Some medicines are not safe during pregnancy.  Take a prenatal vitamin that contains at least 600 micrograms (mcg) of folic acid. Eating and drinking  Eat healthy meals that include: ? Fresh fruits and vegetables. ? Whole grains. ? Good sources of protein, such as meat, eggs, or tofu. ? Low-fat dairy products.  Avoid raw meat and unpasteurized juice, milk, and cheese.  If you feel like you may vomit, or you vomit: ? Eat 4 or 5 small meals a day instead of 3 large meals. ? Try eating a few soda crackers. ? Drink liquids between meals instead of during meals.  You may need to take these actions to prevent or treat trouble pooping: ? Drink enough fluids to keep your pee (urine) pale yellow. ?   Eat foods that are high in fiber. These include beans, whole grains, and fresh fruits and vegetables. ? Limit foods that are high in fat and sugar. These include fried or sweet foods. Activity  Exercise only as told by your doctor. Most people can do their usual exercise routine during pregnancy.  Stop exercising if you have cramps or pain in your lower belly (abdomen) or low back.  Do not exercise if it is too hot or too humid, or if you are in a place of great height (high altitude).  Avoid heavy lifting.  If you choose to, you may have sex unless your doctor tells you not to. Relieving pain and discomfort  Wear a good support bra if your breasts are sore.  Rest with your legs raised (elevated) if you have leg cramps or low back pain.  If you have bulging veins (varicose veins) in your legs: ? Wear support hose as told by your doctor. ? Raise your feet for 15 minutes, 3-4 times a day. ? Limit salt  in your food. Safety  Wear your seat belt at all times when you are in a car.  Talk with your doctor if someone is hurting you or yelling at you.  Talk with your doctor if you are feeling sad or have thoughts of hurting yourself. Lifestyle  Do not use hot tubs, steam rooms, or saunas.  Do not douche. Do not use tampons or scented sanitary pads.  Do not use herbal medicines, illegal drugs, or medicines that are not approved by your doctor. Do not drink alcohol.  Do not smoke or use any products that contain nicotine or tobacco. If you need help quitting, ask your doctor.  Avoid cat litter boxes and soil that is used by cats. These carry germs that can cause harm to the baby and can cause a loss of your baby by miscarriage or stillbirth. General instructions  Keep all follow-up visits. This is important.  Ask for help if you need counseling or if you need help with nutrition. Your doctor can give you advice or tell you where to go for help.  Visit your dentist. At home, brush your teeth with a soft toothbrush. Floss gently.  Write down your questions. Take them to your prenatal visits. Where to find more information  American Pregnancy Association: americanpregnancy.org  American College of Obstetricians and Gynecologists: www.acog.org  Office on Women's Health: womenshealth.gov/pregnancy Contact a doctor if:  You are dizzy.  You have a fever.  You have mild cramps or pressure in your lower belly.  You have a nagging pain in your belly area.  You continue to feel like you may vomit, you vomit, or you have watery poop (diarrhea) for 24 hours or longer.  You have a bad-smelling fluid coming from your vagina.  You have pain when you pee.  You are exposed to a disease that spreads from person to person, such as chickenpox, measles, Zika virus, HIV, or hepatitis. Get help right away if:  You have spotting or bleeding from your vagina.  You have very bad belly cramping  or pain.  You have shortness of breath or chest pain.  You have any kind of injury, such as from a fall or a car crash.  You have new or increased pain, swelling, or redness in an arm or leg. Summary  The first trimester of pregnancy starts on the first day of your last menstrual period until the end of week 12 (months 1 through   3).  Eat 4 or 5 small meals a day instead of 3 large meals.  Do not smoke or use any products that contain nicotine or tobacco. If you need help quitting, ask your doctor.  Keep all follow-up visits. This information is not intended to replace advice given to you by your health care provider. Make sure you discuss any questions you have with your health care provider. Document Revised: 01/31/2020 Document Reviewed: 12/07/2019 Elsevier Patient Education  2021 [employer]  

## 2020-11-15 NOTE — Progress Notes (Signed)
Marcia Villarreal presents for NOB nurse interview visit. Pregnancy confirmation done [id number]. G3. P2002. Pregnancy education material explained and given. 0 cats in home. NOB labs ordered. TSH/HbgA1c ordered due to BMI 30 or greater. HIV labs and drug screen were explained and ordered. PNV encouraged. Genetic screening options discussed. Genetic testing: Unsure. Patient may discuss with the provider. Patient to follow up with provider on 11/18/2020 for NOB physical. All questions answered.

## 2020-11-16 LAB — URINALYSIS, ROUTINE W REFLEX MICROSCOPIC
Bilirubin, UA: NEGATIVE
Glucose, UA: NEGATIVE
Ketones, UA: NEGATIVE
Nitrite, UA: NEGATIVE
Protein,UA: NEGATIVE
RBC, UA: NEGATIVE
Specific Gravity, UA: 1.017 (ref 1.005–1.030)
Urobilinogen, Ur: 0.2 mg/dL (ref 0.2–1.0)
pH, UA: 7 (ref 5.0–7.5)

## 2020-11-16 LAB — MICROSCOPIC EXAMINATION
Casts: NONE SEEN /lpf
RBC: NONE SEEN /hpf (ref 0–2)

## 2020-11-17 LAB — GC/CHLAMYDIA PROBE AMP
Chlamydia trachomatis, NAA: NEGATIVE
Neisseria Gonorrhoeae by PCR: NEGATIVE

## 2020-11-18 ENCOUNTER — Encounter: Payer: Medicaid Other | Admitting: Certified Nurse Midwife

## 2020-11-18 LAB — URINE CULTURE, OB REFLEX

## 2020-11-18 LAB — CULTURE, OB URINE

## 2020-11-18 NOTE — Progress Notes (Signed)
I have reviewed the record and concur with patient management and plan of care.    Serafina Royals, CNM [facility], [facility] 11/18/20 9:04 AM

## 2020-11-19 LAB — ABO AND RH: Rh Factor: POSITIVE

## 2020-11-19 LAB — VIRAL HEPATITIS HBV, HCV
HCV Ab: <0.1 s/co ratio (ref 0.0–0.9)
Hep B Core Total Ab: NEGATIVE
Hep B Surface Ab, Qual: NONREACTIVE
Hepatitis B Surface Ag: NEGATIVE

## 2020-11-19 LAB — VARICELLA ZOSTER ANTIBODY, IGG: Varicella zoster IgG: <135 index — ABNORMAL LOW (ref >165)

## 2020-11-19 LAB — RUBELLA SCREEN: Rubella Antibodies, IGG: 2.7 index (ref >0.99)

## 2020-11-19 LAB — TSH: TSH: 1.5 u[IU]/mL (ref 0.450–4.500)

## 2020-11-19 LAB — HCV INTERPRETATION

## 2020-11-19 LAB — HEMOGLOBIN A1C
Est. average glucose Bld gHb Est-mCnc: 131 mg/dL
Hgb A1c MFr Bld: 6.2 % — ABNORMAL HIGH (ref 4.8–5.6)

## 2020-11-19 LAB — HIV ANTIBODY (ROUTINE TESTING W REFLEX): HIV Screen 4th Generation wRfx: NONREACTIVE

## 2020-11-19 LAB — ANTIBODY SCREEN: Antibody Screen: NEGATIVE

## 2020-11-19 LAB — RPR: RPR Ser Ql: NONREACTIVE

## 2020-11-21 ENCOUNTER — Encounter: Payer: Medicaid Other | Admitting: Certified Nurse Midwife

## 2020-11-22 ENCOUNTER — Encounter: Payer: Self-pay | Admitting: Certified Nurse Midwife

## 2020-11-22 DIAGNOSIS — O09899 Supervision of other high risk pregnancies, unspecified trimester: Secondary | ICD-10-CM | POA: Insufficient documentation

## 2020-11-22 DIAGNOSIS — Z2839 Other underimmunization status: Secondary | ICD-10-CM | POA: Insufficient documentation

## 2020-11-22 DIAGNOSIS — Z6730 Type AB blood, Rh positive: Secondary | ICD-10-CM | POA: Insufficient documentation

## 2020-11-22 DIAGNOSIS — R7303 Prediabetes: Secondary | ICD-10-CM | POA: Insufficient documentation

## 2020-12-02 ENCOUNTER — Other Ambulatory Visit: Payer: Self-pay

## 2020-12-02 ENCOUNTER — Other Ambulatory Visit: Payer: Self-pay | Admitting: Certified Nurse Midwife

## 2020-12-02 ENCOUNTER — Ambulatory Visit
Admission: RE | Admit: 2020-12-02 | Discharge: 2020-12-02 | Disposition: A | Discharge status: Home / Self Care | Payer: Medicaid Other | Source: Ambulatory Visit | Attending: Certified Nurse Midwife | Admitting: Certified Nurse Midwife

## 2020-12-02 DIAGNOSIS — Z3201 Encounter for pregnancy test, result positive: Secondary | ICD-10-CM | POA: Insufficient documentation

## 2020-12-02 DIAGNOSIS — N926 Irregular menstruation, unspecified: Secondary | ICD-10-CM

## 2020-12-02 DIAGNOSIS — O26859 Spotting complicating pregnancy, unspecified trimester: Secondary | ICD-10-CM | POA: Insufficient documentation

## 2020-12-03 ENCOUNTER — Encounter: Payer: Self-pay | Admitting: Certified Nurse Midwife

## 2020-12-09 LAB — MONITOR DRUG PROFILE 14(MW)
Amphetamine Scrn, Ur: NEGATIVE ng/mL
BARBITURATE SCREEN URINE: NEGATIVE ng/mL
BENZODIAZEPINE SCREEN, URINE: NEGATIVE ng/mL
Buprenorphine, Urine: NEGATIVE ng/mL
Cocaine (Metab) Scrn, Ur: NEGATIVE ng/mL
Creatinine(Crt), U: 76.1 mg/dL (ref 20.0–300.0)
Fentanyl, Urine: NEGATIVE pg/mL
Meperidine Screen, Urine: NEGATIVE ng/mL
Methadone Screen, Urine: NEGATIVE ng/mL
OXYCODONE+OXYMORPHONE UR QL SCN: NEGATIVE ng/mL
Opiate Scrn, Ur: NEGATIVE ng/mL
Ph of Urine: 6.9 (ref 4.5–8.9)
Phencyclidine Qn, Ur: NEGATIVE ng/mL
Propoxyphene Scrn, Ur: NEGATIVE ng/mL
SPECIFIC GRAVITY: 1.023
Tramadol Screen, Urine: NEGATIVE ng/mL

## 2020-12-09 LAB — NICOTINE SCREEN, URINE: Cotinine Ql Scrn, Ur: POSITIVE ng/mL — AB

## 2020-12-09 LAB — CANNABINOID (GC/MS), URINE
Cannabinoid: POSITIVE — AB
Carboxy THC (GC/MS): 50 ng/mL

## 2020-12-12 ENCOUNTER — Encounter: Payer: Self-pay | Admitting: Certified Nurse Midwife

## 2021-01-31 ENCOUNTER — Telehealth: Payer: Self-pay | Admitting: Certified Nurse Midwife

## 2021-01-31 NOTE — Telephone Encounter (Signed)
Results were faxed and confirmation received.

## 2021-01-31 NOTE — Telephone Encounter (Signed)
Patient called and wants her pregnancy confirmation faxed to [phone]

## 2021-02-07 ENCOUNTER — Encounter: Payer: Medicaid Other | Admitting: Certified Nurse Midwife
